# Patient Record
Sex: Male | Born: 1980 | Race: White | Hispanic: No | Marital: Married | Smoking: Never smoker
Health system: Southern US, Community
[De-identification: ages and names within clinical notes are randomized; demographics above are authoritative.]

## PROBLEM LIST (undated history)

## (undated) HISTORY — PX: ORTHOPEDIC SURGERY: SHX850

---
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
## 2003-08-24 ENCOUNTER — Emergency Department (HOSPITAL_COMMUNITY): Admission: EM | Admit: 2003-08-24 | Discharge: 2003-08-25 | Payer: Self-pay

## 2003-10-29 ENCOUNTER — Emergency Department (HOSPITAL_COMMUNITY): Admission: EM | Admit: 2003-10-29 | Discharge: 2003-10-29 | Payer: Self-pay | Admitting: Emergency Medicine

## 2008-12-09 ENCOUNTER — Emergency Department (HOSPITAL_BASED_OUTPATIENT_CLINIC_OR_DEPARTMENT_OTHER): Admission: EM | Admit: 2008-12-09 | Discharge: 2008-12-09 | Payer: Self-pay | Admitting: Emergency Medicine

## 2012-02-28 ENCOUNTER — Emergency Department (HOSPITAL_BASED_OUTPATIENT_CLINIC_OR_DEPARTMENT_OTHER)
Admission: EM | Admit: 2012-02-28 | Discharge: 2012-02-28 | Disposition: A | Discharge status: Home / Self Care | Payer: BC Managed Care – PPO | Attending: Emergency Medicine | Admitting: Emergency Medicine

## 2012-02-28 ENCOUNTER — Emergency Department (HOSPITAL_BASED_OUTPATIENT_CLINIC_OR_DEPARTMENT_OTHER): Payer: BC Managed Care – PPO

## 2012-02-28 ENCOUNTER — Encounter (HOSPITAL_BASED_OUTPATIENT_CLINIC_OR_DEPARTMENT_OTHER): Payer: Self-pay | Admitting: Emergency Medicine

## 2012-02-28 DIAGNOSIS — R42 Dizziness and giddiness: Secondary | ICD-10-CM | POA: Insufficient documentation

## 2012-02-28 DIAGNOSIS — E860 Dehydration: Secondary | ICD-10-CM | POA: Insufficient documentation

## 2012-02-28 DIAGNOSIS — B349 Viral infection, unspecified: Secondary | ICD-10-CM

## 2012-02-28 DIAGNOSIS — R5381 Other malaise: Secondary | ICD-10-CM | POA: Insufficient documentation

## 2012-02-28 DIAGNOSIS — R509 Fever, unspecified: Secondary | ICD-10-CM | POA: Insufficient documentation

## 2012-02-28 DIAGNOSIS — M542 Cervicalgia: Secondary | ICD-10-CM | POA: Insufficient documentation

## 2012-02-28 DIAGNOSIS — G43909 Migraine, unspecified, not intractable, without status migrainosus: Secondary | ICD-10-CM | POA: Insufficient documentation

## 2012-02-28 DIAGNOSIS — B9789 Other viral agents as the cause of diseases classified elsewhere: Secondary | ICD-10-CM | POA: Insufficient documentation

## 2012-02-28 DIAGNOSIS — R110 Nausea: Secondary | ICD-10-CM | POA: Insufficient documentation

## 2012-02-28 DIAGNOSIS — Z79899 Other long term (current) drug therapy: Secondary | ICD-10-CM | POA: Insufficient documentation

## 2012-02-28 LAB — CBC WITH DIFFERENTIAL/PLATELET
Basophils Absolute: 0 10*3/uL (ref 0.0–0.1)
Basophils Relative: 0 % (ref 0–1)
Eosinophils Absolute: 0 10*3/uL (ref 0.0–0.7)
HCT: 41.8 % (ref 39.0–52.0)
Hemoglobin: 14.6 g/dL (ref 13.0–17.0)
Lymphs Abs: 0.7 10*3/uL (ref 0.7–4.0)
MCH: 30.5 pg (ref 26.0–34.0)
MCHC: 34.9 g/dL (ref 30.0–36.0)
MCV: 87.3 fL (ref 78.0–100.0)
Monocytes Relative: 10 % (ref 3–12)
Neutrophils Relative %: 84 % — ABNORMAL HIGH (ref 43–77)
RDW: 11.9 % (ref 11.5–15.5)

## 2012-02-28 LAB — COMPREHENSIVE METABOLIC PANEL
Albumin: 4.2 g/dL (ref 3.5–5.2)
Alkaline Phosphatase: 67 U/L (ref 39–117)
CO2: 25 mEq/L (ref 19–32)
GFR calc non Af Amer: >90 mL/min (ref >90)
Sodium: 138 mEq/L (ref 135–145)
Total Bilirubin: 1.3 mg/dL — ABNORMAL HIGH (ref 0.3–1.2)

## 2012-02-28 LAB — URINALYSIS, ROUTINE W REFLEX MICROSCOPIC
Glucose, UA: NEGATIVE mg/dL
Hgb urine dipstick: NEGATIVE
Nitrite: NEGATIVE
Urobilinogen, UA: 1 mg/dL (ref 0.0–1.0)

## 2012-02-28 LAB — LACTIC ACID, PLASMA: Lactic Acid, Venous: 0.9 mmol/L (ref 0.5–2.2)

## 2012-02-28 LAB — LIPASE, BLOOD: Lipase: 49 U/L (ref 11–59)

## 2012-02-28 MED ORDER — DIPHENHYDRAMINE HCL 50 MG/ML IJ SOLN
12.5000 mg | Freq: Once | INTRAMUSCULAR | Status: AC
  Administered 2012-02-28: 12.5 mg via INTRAVENOUS
  Filled 2012-02-28: qty 1

## 2012-02-28 MED ORDER — KETOROLAC TROMETHAMINE 30 MG/ML IJ SOLN
30.0000 mg | Freq: Once | INTRAMUSCULAR | Status: AC
  Administered 2012-02-28: 30 mg via INTRAVENOUS

## 2012-02-28 MED ORDER — SODIUM CHLORIDE 0.9 % IV BOLUS (SEPSIS)
1000.0000 mL | Freq: Once | INTRAVENOUS | Status: AC
  Administered 2012-02-28: 1000 mL via INTRAVENOUS

## 2012-02-28 MED ORDER — KETOROLAC TROMETHAMINE 30 MG/ML IJ SOLN
INTRAMUSCULAR | Status: AC
  Filled 2012-02-28: qty 1

## 2012-02-28 MED ORDER — METOCLOPRAMIDE HCL 5 MG/ML IJ SOLN
10.0000 mg | Freq: Once | INTRAMUSCULAR | Status: AC
  Administered 2012-02-28: 10 mg via INTRAVENOUS
  Filled 2012-02-28: qty 2

## 2012-02-28 NOTE — ED Notes (Signed)
MD at bedside. 

## 2012-02-28 NOTE — ED Notes (Signed)
Onset 11am  Chills fever no appetite, headache,body aches fatigue

## 2012-02-28 NOTE — ED Provider Notes (Signed)
History     CSN: [id number]  Arrival date & time 02/28/12  1631   First MD Initiated Contact with Patient 02/28/12 1720      Chief Complaint  Patient presents with  . Fever    (Consider location/radiation/quality/duration/timing/severity/associated sxs/prior treatment) HPI Patient is a 31 year old male who presents today complaining of having onset today at 11 AM chills and fever. He reports having no headache and also complaining of diffuse severe body aches and chills. Patient was at work when this began. He came home and awoke with a fever of 103 taken orally. He is afebrile on presentation here. He is mild tachycardia with a heart rate of 101.vital signs are otherwise within normal limits. He denies cough, urinary symptoms, and vomiting. He denies constipation diarrhea. Patient does endorse a headache which she reports as an 8/10. This is similar to prior migraines. He also complains of some neck discomfort but has full range of motion and no rashes. He has no reported tick exposures. Patient does have a daughter at home who is sick with a "summer cold" last week. Patient has not taken anything for his symptoms. Patient has no history of medical problems. Patient reports that his headache is bilateral and frontal and described as an ache. There's no radiation. There are no other associated or modifying factors. History reviewed. No pertinent past medical history.  Past Surgical History  Procedure Date  . Orthopedic surgery     History reviewed. No pertinent family history.  History  Substance Use Topics  . Smoking status: Never Smoker   . Smokeless tobacco: Not on file  . Alcohol Use: Yes     occ      Review of Systems  Constitutional: Positive for fever, chills and fatigue.  HENT: Positive for neck pain.   Eyes: Negative.   Respiratory: Negative.   Cardiovascular: Negative.   Gastrointestinal: Positive for nausea.  Genitourinary: Negative.   Skin: Negative.  Negative  for rash.  Neurological: Positive for light-headedness and headaches.  Hematological: Negative.   Psychiatric/Behavioral: Negative.   All other systems reviewed and are negative.    Allergies  Review of patient's allergies indicates no known allergies.  Home Medications   Current Outpatient Rx  Name Route Sig Dispense Refill  . CLOMIPHENE CITRATE 50 MG PO TABS Oral Take 50 mg by mouth daily.    Marland Kitchen GLUCOSAMINE HCL PO Oral Take 1 tablet by mouth daily.    Marland Kitchen NAPROXEN SODIUM 220 MG PO TABS Oral Take 220 mg by mouth daily. For back pain    . FISH OIL PO Oral Take 1 capsule by mouth daily.      BP 112/74  Pulse 101  Temp 98.5 F (36.9 C) (Oral)  Resp 20  Ht 6' (1.829 m)  Wt 210 lb (95.255 kg)  BMI 28.48 kg/m2  SpO2 98%  Physical Exam  Nursing note and vitals reviewed. GEN: Well-developed, well-nourished male in no distress HEENT: Atraumatic, normocephalic. Oropharynx clear without erythema EYES: PERRLA BL, no scleral icterus. NECK: Trachea midline, no meningismus, pain-free full range of motion. Some tenderness to palpation over the paraspinal muscles  CV: regular rate and rhythm. No murmurs, rubs, or gallops PULM: No respiratory distress.  No crackles, wheezes, or rales. GI: soft, non-tender. No guarding, rebound, or tenderness. + bowel sounds  GU: deferred Neuro: cranial nerves grossly 2-12 intact, no abnormalities of strength or sensation, A and O x 3 MSK: Patient moves all 4 extremities symmetrically, no deformity, edema, or injury  noted Skin: No rashes petechiae, purpura, or jaundice Psych: no abnormality of mood   ED Course  Procedures (including critical care time)  Labs Reviewed  CBC WITH DIFFERENTIAL - Abnormal; Notable for the following:    Neutrophils Relative 84 (*)     Neutro Abs 8.7 (*)     Lymphocytes Relative 7 (*)     All other components within normal limits  COMPREHENSIVE METABOLIC PANEL - Abnormal; Notable for the following:    Glucose, Bld 116  (*)     Total Bilirubin 1.3 (*)     All other components within normal limits  LIPASE, BLOOD  URINALYSIS, ROUTINE W REFLEX MICROSCOPIC  LACTIC ACID, PLASMA   Dg Chest 2 View  02/28/2012  *RADIOLOGY REPORT*  Clinical Data: Fatigue, dizziness.  Lightheadedness.  CHEST - 2 VIEW  Comparison: None.  Findings: Cardiomediastinal silhouette is within normal limits. The lungs are free of focal consolidations and pleural effusions. No pulmonary edema. Visualized osseous structures have a normal appearance.  IMPRESSION: Negative exam.   Original Report Authenticated By: Patterson Hammersmith, M.D.      1. Viral syndrome   2. Dehydration   3. Migraine       MDM   Patient was evaluated by myself. He reported fever to 103 at home. The patient felt warm repeat temperatures are within normal limits. Vital signs are not abnormal here. Patient was treated with IV fluids. Patient had headache similar to prior migraines. He was treated with Reglan and Benadryl for this. He at no rash nicely no meningismus. Laboratory workup showed no leukocytosis, no signs of pneumonia, no urinary tract infection, no abnormality of liver function or electrolytes. Patient did have elevated BUN and creatinine ratio. I suspect he had viral symptoms similar to those of his daughter and had with resultant dehydration likely development of one of his typical migraines as a result. Patient was feeling much better and also received a dose of Toradol 30 mg IV. He was instructed to return if he had return of his fever that had not resolved with ibuprofen or Tylenol. Patient is told to drink plenty of fluids. Work note was provided. He was discharged in good condition.        Cyndra Numbers, MD 02/28/12 2002

## 2013-03-26 IMAGING — CR DG CHEST 2V
2 series · 2 of 2 positions shown · non-contrast
Comparison: None.

CLINICAL DATA: Fatigue, dizziness.  Lightheadedness.

CHEST - 2 VIEW

[w chest pa]
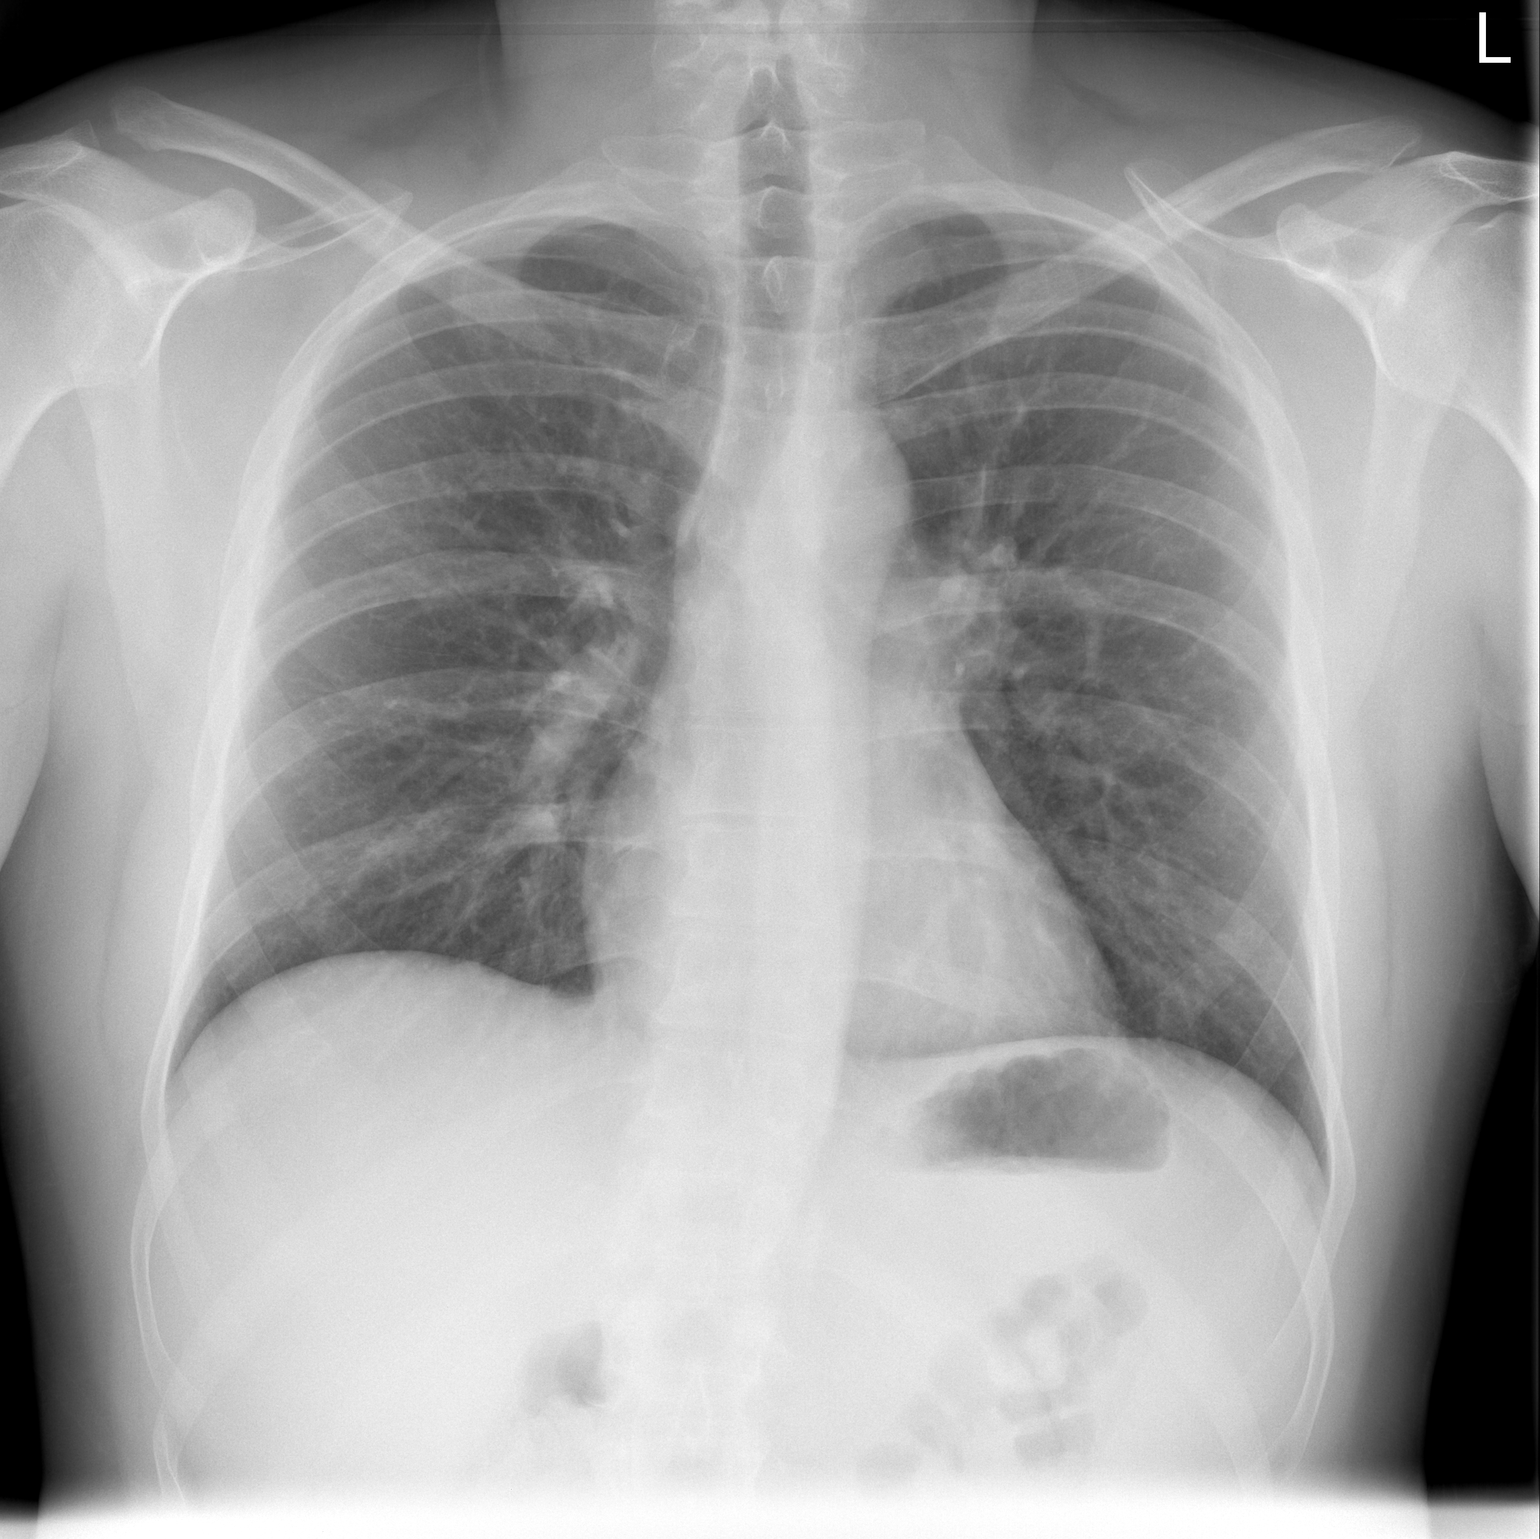

[w chest lat]
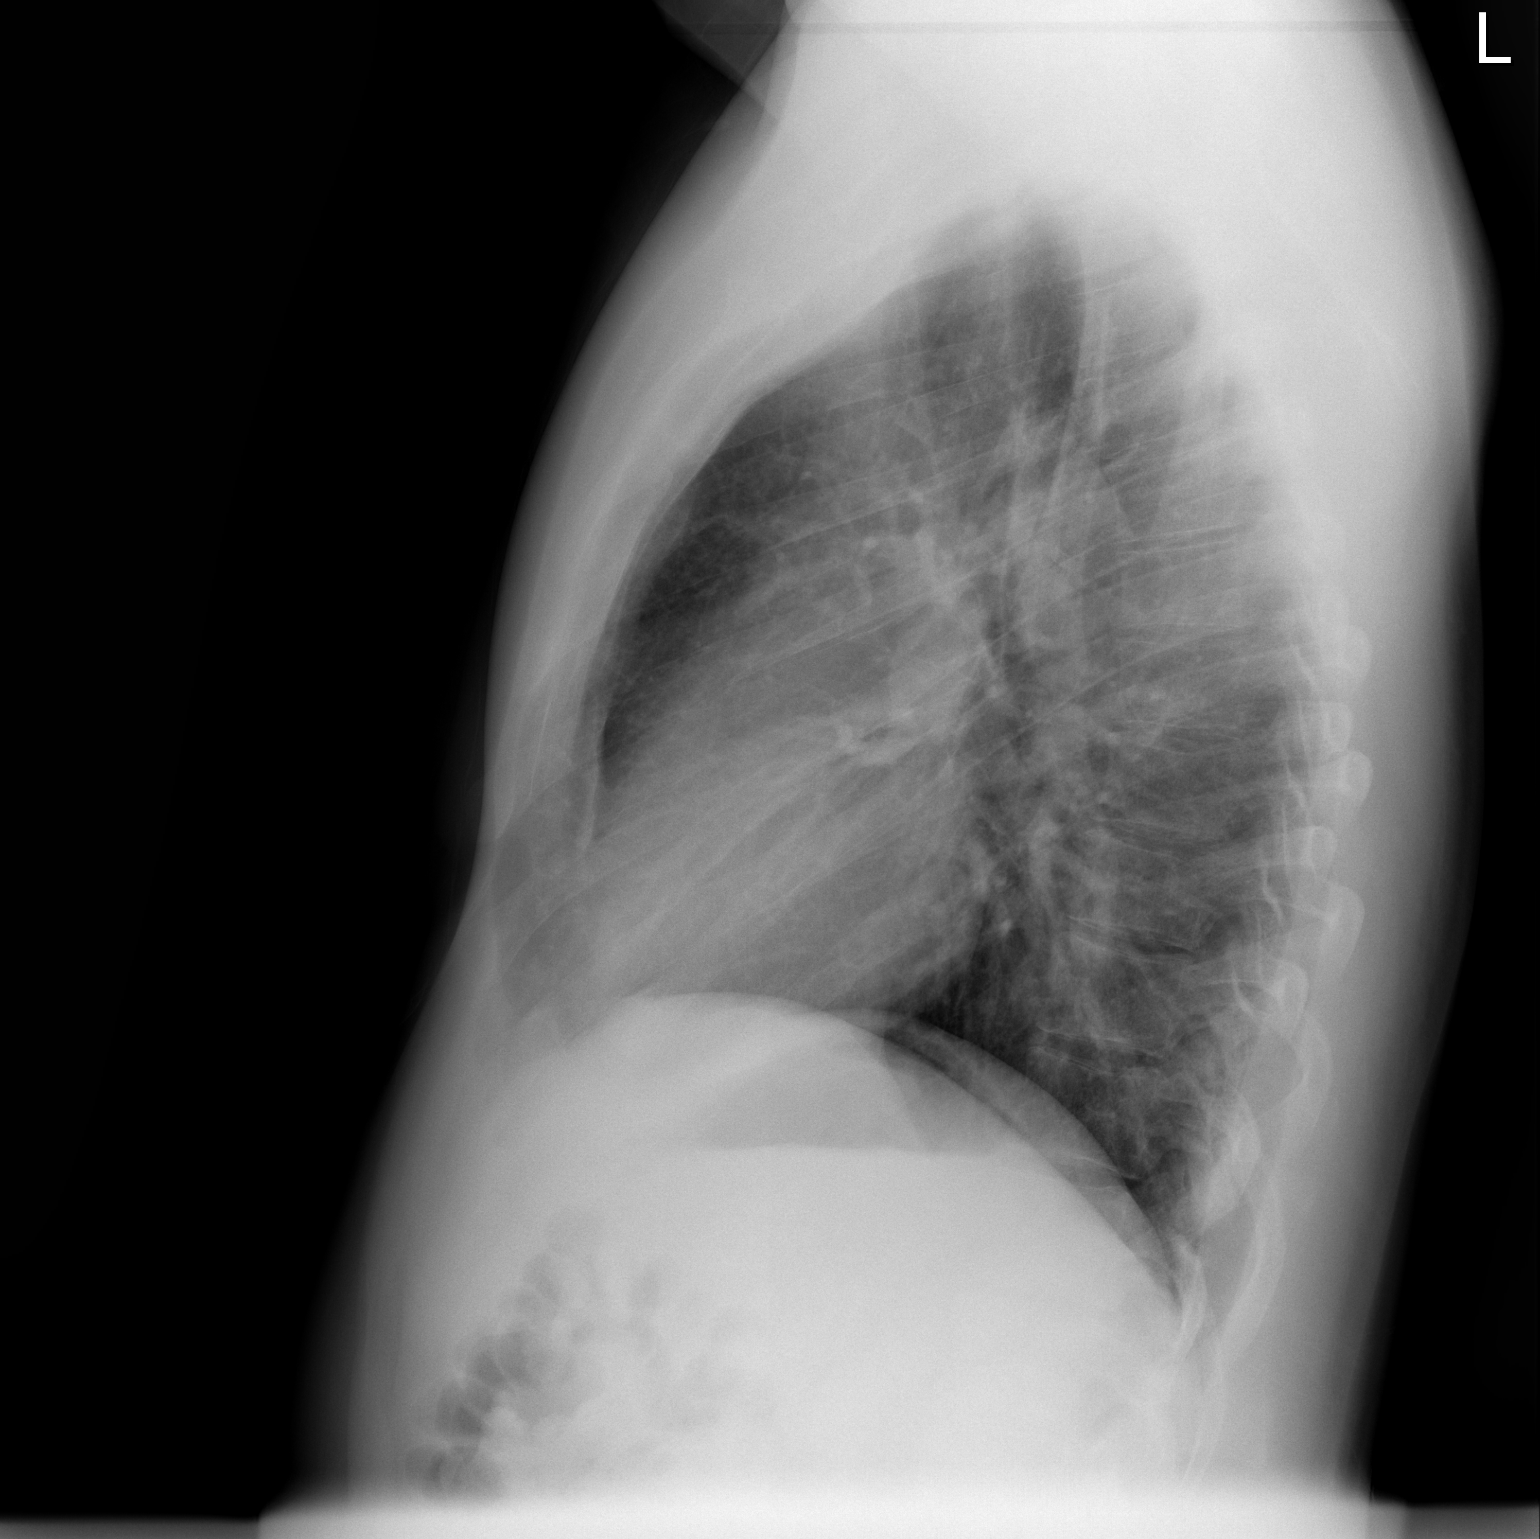

[2 of 2 positions shown; findings below may reference images not displayed]

FINDINGS: Cardiomediastinal silhouette is within normal limits.
The lungs are free of focal consolidations and pleural effusions.
No pulmonary edema. Visualized osseous structures have a normal
appearance.
IMPRESSION: Negative exam.

## 2018-04-22 ENCOUNTER — Ambulatory Visit (HOSPITAL_COMMUNITY)
Admission: RE | Admit: 2018-04-22 | Discharge: 2018-04-22 | Disposition: A | Discharge status: Home / Self Care | Payer: 59 | Attending: Psychiatry | Admitting: Psychiatry

## 2018-04-22 DIAGNOSIS — F3113 Bipolar disorder, current episode manic without psychotic features, severe: Secondary | ICD-10-CM | POA: Insufficient documentation

## 2018-04-23 ENCOUNTER — Encounter (HOSPITAL_COMMUNITY): Payer: Self-pay

## 2018-04-23 ENCOUNTER — Inpatient Hospital Stay (HOSPITAL_COMMUNITY)
Admission: AD | Admit: 2018-04-23 | Discharge: 2018-04-28 | DRG: 885 | Disposition: A | Discharge status: Home / Self Care | Payer: 59 | Attending: Psychiatry | Admitting: Psychiatry

## 2018-04-23 ENCOUNTER — Other Ambulatory Visit: Payer: Self-pay

## 2018-04-23 ENCOUNTER — Emergency Department (HOSPITAL_COMMUNITY)
Admission: EM | Admit: 2018-04-23 | Discharge: 2018-04-23 | Disposition: A | Discharge status: Other Acute Inpatient Hospital | Payer: 59 | Attending: Emergency Medicine | Admitting: Emergency Medicine

## 2018-04-23 DIAGNOSIS — F909 Attention-deficit hyperactivity disorder, unspecified type: Secondary | ICD-10-CM | POA: Diagnosis present

## 2018-04-23 DIAGNOSIS — G4700 Insomnia, unspecified: Secondary | ICD-10-CM | POA: Diagnosis present

## 2018-04-23 DIAGNOSIS — F302 Manic episode, severe with psychotic symptoms: Principal | ICD-10-CM | POA: Insufficient documentation

## 2018-04-23 DIAGNOSIS — Z79899 Other long term (current) drug therapy: Secondary | ICD-10-CM | POA: Insufficient documentation

## 2018-04-23 DIAGNOSIS — F312 Bipolar disorder, current episode manic severe with psychotic features: Secondary | ICD-10-CM

## 2018-04-23 DIAGNOSIS — F419 Anxiety disorder, unspecified: Secondary | ICD-10-CM | POA: Diagnosis present

## 2018-04-23 DIAGNOSIS — F319 Bipolar disorder, unspecified: Secondary | ICD-10-CM | POA: Diagnosis present

## 2018-04-23 LAB — COMPREHENSIVE METABOLIC PANEL
ALBUMIN: 4.4 g/dL (ref 3.5–5.0)
ALK PHOS: 72 U/L (ref 38–126)
ALT: 31 U/L (ref 0–44)
AST: 31 U/L (ref 15–41)
Anion gap: 7 (ref 5–15)
BUN: 17 mg/dL (ref 6–20)
CALCIUM: 9 mg/dL (ref 8.9–10.3)
CO2: 27 mmol/L (ref 22–32)
CREATININE: 0.81 mg/dL (ref 0.61–1.24)
Chloride: 106 mmol/L (ref 98–111)
GFR calc Af Amer: >60 mL/min (ref >60)
GFR calc non Af Amer: >60 mL/min (ref >60)
GLUCOSE: 113 mg/dL — AB (ref 70–99)
Potassium: 3.7 mmol/L (ref 3.5–5.1)
SODIUM: 140 mmol/L (ref 135–145)
Total Bilirubin: 1 mg/dL (ref 0.3–1.2)
Total Protein: 7.2 g/dL (ref 6.5–8.1)

## 2018-04-23 LAB — RAPID URINE DRUG SCREEN, HOSP PERFORMED
AMPHETAMINES: NOT DETECTED
Barbiturates: NOT DETECTED
Benzodiazepines: NOT DETECTED
COCAINE: NOT DETECTED
OPIATES: NOT DETECTED
TETRAHYDROCANNABINOL: POSITIVE — AB

## 2018-04-23 LAB — CBC
HEMATOCRIT: 45.7 % (ref 39.0–52.0)
Hemoglobin: 14.7 g/dL (ref 13.0–17.0)
MCH: 29.4 pg (ref 26.0–34.0)
MCHC: 32.2 g/dL (ref 30.0–36.0)
MCV: 91.4 fL (ref 80.0–100.0)
NRBC: 0 % (ref 0.0–0.2)
PLATELETS: 199 10*3/uL (ref 150–400)
RBC: 5 MIL/uL (ref 4.22–5.81)
RDW: 11.9 % (ref 11.5–15.5)
WBC: 8.9 10*3/uL (ref 4.0–10.5)

## 2018-04-23 LAB — ETHANOL: Alcohol, Ethyl (B): <10 mg/dL (ref <10)

## 2018-04-23 MED ORDER — OLANZAPINE 5 MG PO TBDP
5.0000 mg | ORAL_TABLET | Freq: Two times a day (BID) | ORAL | Status: DC
  Filled 2018-04-23: qty 1

## 2018-04-23 MED ORDER — LORAZEPAM 1 MG PO TABS: 1.0000 mg | ORAL_TABLET | Freq: Once | ORAL | Status: DC

## 2018-04-23 MED ORDER — ALUM & MAG HYDROXIDE-SIMETH 200-200-20 MG/5ML PO SUSP: 30.0000 mL | ORAL | Status: DC | PRN

## 2018-04-23 MED ORDER — MAGNESIUM HYDROXIDE 400 MG/5ML PO SUSP: 30.0000 mL | Freq: Every day | ORAL | Status: DC | PRN

## 2018-04-23 MED ORDER — TRAZODONE HCL 50 MG PO TABS
50.0000 mg | ORAL_TABLET | Freq: Every evening | ORAL | Status: DC | PRN
  Filled 2018-04-23: qty 1

## 2018-04-23 MED ORDER — OLANZAPINE 5 MG PO TBDP
5.0000 mg | ORAL_TABLET | Freq: Two times a day (BID) | ORAL | Status: DC
  Filled 2018-04-23 (×6): qty 1

## 2018-04-23 MED ORDER — ACETAMINOPHEN 325 MG PO TABS: 650.0000 mg | ORAL_TABLET | Freq: Four times a day (QID) | ORAL | Status: DC | PRN

## 2018-04-23 NOTE — BH Assessment (Signed)
[facility] Assessment Progress Note  Per Juanetta Beets, DO, this pt requires psychiatric hospitalization.  Malva Limes, RN, [facility] has assigned pt to [facility] Rm [id number]; [facility] will be ready to receive pt at 13:00.  Pt presents under IVC initiated by EDP Cathren Laine, MD, and IVC documents have been faxed to [facility]  Pt's nurse, Diane, has been notified, and agrees to call report to [phone].  Pt is to be transported via [employer].   Doylene Canning, [place] [facility] Coordinator [phone]

## 2018-04-23 NOTE — ED Notes (Signed)
Pt states that he didn't want his ativan at this time

## 2018-04-23 NOTE — ED Notes (Signed)
Pt is not wanting to stay and doesn't understand how this happened, he states that he only agreed to go to [facility] is to prove to his dad and brother that nothing was wrong with him, he says now he's tired of playing the game and he's ready to go

## 2018-04-23 NOTE — Progress Notes (Signed)
Adult Psychoeducational Group Note  Date:  04/23/2018 Time:  8:41 PM  Group Topic/Focus:  Wrap-Up Group:   The focus of this group is to help patients review their daily goal of treatment and discuss progress on daily workbooks.  Participation Level:  Active  Participation Quality:  Appropriate  Affect:  Appropriate  Cognitive:  Appropriate  Insight: Appropriate  Engagement in Group:  Engaged  Modes of Intervention:  Discussion  Additional Comments: The patient expressed that he rates today a 10.The patient also said that he attended groups.  Octavio Manns 04/23/2018, 8:41 PM

## 2018-04-23 NOTE — Progress Notes (Signed)
Nursing Progress Note: 7p-7a D: Pt currently presents with a anxious/manic affect and behavior. Pt states "I never take meds unless it's advil. I don't need it and/or want pills." Interacting appropriately with the milieu. Pt reports good sleep during the previous night with current medication regimen. Pt did attend wrap-up group.  A: Pt refused medications per providers orders. Pt's labs and vitals were monitored throughout the night. Pt supported emotionally and encouraged to express concerns and questions. Pt educated on medications.  R: Pt's safety ensured with 15 minute and environmental checks. Pt currently denies SI, HI, and AVH. Pt verbally contracts to seek staff if SI,HI, or AVH occurs and to consult with staff before acting on any harmful thoughts. Will continue to monitor.

## 2018-04-23 NOTE — Progress Notes (Signed)
Patient ID: Arthur Mitchell, male   DOB: 1981/01/16, 37 y.o.   MRN: [id number] Patient admitted to the unit due to increase bizarre behaviors, delusions of grandeur believing that he could make his family millions by starting a new cooperation and having them invest 75.00 a month.  Patient is hyperverbal, euphoric, and tangential.  Skin assessment complete patient found to be free of all injury. Patient was also found to be free of all contraband.   Patient cooperative during admission process and admitted and oriented to the unit without incident.

## 2018-04-23 NOTE — ED Notes (Signed)
Report called Engineer, site. Bed is not ready as another pt occupies it. Nurse Christen Bame will call when the bed is ready.

## 2018-04-23 NOTE — ED Notes (Signed)
Refused medications and said, "I do not take any medications, except Adderal. I smoke marijuana. I have pain from past broken femur and do not take pain medication for that." Speech is pressured with tangential thoughts. Pt is cooperative and not unpleasant.

## 2018-04-23 NOTE — ED Notes (Signed)
Per [facility], patient is completely manic and is having delusional ideas and thoughts Pt qualifies for inpatient but no rooms available

## 2018-04-23 NOTE — ED Triage Notes (Signed)
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

## 2018-04-23 NOTE — ED Notes (Signed)
Patient is calm and cooperative at this time. Patient states he is here under a misunderstanding. Patient denies SI/HI/AVH. Plan of care discussed. Encouragement and support provided and safety maintain. Q 15 min safety checks in place and video monitoring.

## 2018-04-23 NOTE — ED Notes (Signed)
Called [facility] for transport to [facility] 

## 2018-04-23 NOTE — ED Provider Notes (Signed)
[facility]-EMERGENCY DEPT Provider Note   CSN: [id number] Arrival date & time: 04/23/18  0045     History   Chief Complaint Chief Complaint  Patient presents with  . Medical Clearance    HPI Arthur Mitchell is a 37 y.o. male.  Patient c/o hx adhd presents from behavioral health - they feel needs inpatient tx for acute manic/bipolar episode, but currently no inpatient beds there. Pt has little insight into current symptoms - level 5 caveat. 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 Pt very rapidly moves from one unrelated thought to next, flight of ideas. Pt states he has not taken his meds in 1 week and has never felt better. State he is full of energy, and only needs 3 hours of sleep per night, 'never better'.   The history is provided by the patient. The history is limited by the condition of the patient.    History reviewed. No pertinent past medical history.  There are no active problems to display for this patient.   Past Surgical History:  Procedure Laterality Date  . ORTHOPEDIC SURGERY          Home Medications    Prior to Admission medications   Medication Sig Start Date End Date Taking? Authorizing Provider  clomiPHENE (CLOMID) 50 MG tablet Take 50 mg by mouth daily.    [provider]  GLUCOSAMINE HCL PO Take 1 tablet by mouth daily.    [provider]  naproxen sodium (ALEVE) 220 MG tablet Take 220 mg by mouth daily. For back pain    [provider]  Omega-3 Fatty Acids (FISH OIL PO) Take 1 capsule by mouth daily.    [provider]    Family History History reviewed. No pertinent family history.  Social History Social History   Tobacco Use  . Smoking status: Never Smoker  . Smokeless tobacco: Never  Used  Substance Use Topics  . Alcohol use: Yes    Comment: occ  . Drug use: No     Allergies   Patient has no known allergies.   Review of Systems Review of Systems  Constitutional: Negative for fever.  HENT: Negative for sore throat.   Eyes: Negative for redness.  Respiratory: Negative for shortness of breath.   Cardiovascular: Negative for chest pain.  Gastrointestinal: Negative for abdominal pain.  Genitourinary: Negative for flank pain.  Musculoskeletal: Negative for back pain and neck pain.  Skin: Negative for rash.  Neurological: Negative for headaches.  Hematological: Does not bruise/bleed easily.  Psychiatric/Behavioral: Negative for suicidal ideas.     Physical Exam Updated Vital Signs BP (!) 141/96 (BP Location: Left Arm)   Pulse 60   Temp 98.3 F (36.8 C) (Oral)   Resp 18   Ht 1.854 m (6\' 1" )   Wt 83.9 kg   SpO2 100%   BMI 24.41 kg/m   Physical Exam  Constitutional: He appears well-developed and well-nourished.  HENT:  Mouth/Throat: Oropharynx is clear and moist.  Eyes: Pupils are equal, round, and reactive to light. Conjunctivae are normal.  Neck: Neck supple. No tracheal deviation present. No thyromegaly present.  Cardiovascular: Normal rate, regular rhythm, normal heart sounds and intact distal pulses.  Pulmonary/Chest: Effort normal and breath sounds normal. No accessory muscle usage. No  respiratory distress.  Abdominal: Soft. Bowel sounds are normal. He exhibits no distension. There is no tenderness.  Musculoskeletal: He exhibits no edema.  Neurological: He is alert.  Speech clear/fluent. Ambulates w steady gait.   Skin: Skin is warm and dry. No rash noted.  Psychiatric:  Patient appears to be acutely manic, elevated mood, hyperverbal, flight of ideas.   Nursing note and vitals reviewed.    ED Treatments / Results  Labs (all labs ordered are listed, but only abnormal results are displayed) Results for orders placed or performed during  the hospital encounter of 04/23/18  Comprehensive metabolic panel  Result Value Ref Range   Sodium 140 135 - 145 mmol/L   Potassium 3.7 3.5 - 5.1 mmol/L   Chloride 106 98 - 111 mmol/L   CO2 27 22 - 32 mmol/L   Glucose, Bld 113 (H) 70 - 99 mg/dL   BUN 17 6 - 20 mg/dL   Creatinine, Ser 1.61 0.61 - 1.24 mg/dL   Calcium 9.0 8.9 - 09.6 mg/dL   Total Protein 7.2 6.5 - 8.1 g/dL   Albumin 4.4 3.5 - 5.0 g/dL   AST 31 15 - 41 U/L   ALT 31 0 - 44 U/L   Alkaline Phosphatase 72 38 - 126 U/L   Total Bilirubin 1.0 0.3 - 1.2 mg/dL   GFR calc non Af Amer >60 >60 mL/min   GFR calc Af Amer >60 >60 mL/min   Anion gap 7 5 - 15  Ethanol  Result Value Ref Range   Alcohol, Ethyl (B) <10 <10 mg/dL  cbc  Result Value Ref Range   WBC 8.9 4.0 - 10.5 K/uL   RBC 5.00 4.22 - 5.81 MIL/uL   Hemoglobin 14.7 13.0 - 17.0 g/dL   HCT 04.5 40.9 - 81.1 %   MCV 91.4 80.0 - 100.0 fL   MCH 29.4 26.0 - 34.0 pg   MCHC 32.2 30.0 - 36.0 g/dL   RDW 91.4 78.2 - 95.6 %   Platelets 199 150 - 400 K/uL   nRBC 0.0 0.0 - 0.2 %  Rapid urine drug screen (hospital performed)  Result Value Ref Range   Opiates NONE DETECTED NONE DETECTED   Cocaine NONE DETECTED NONE DETECTED   Benzodiazepines NONE DETECTED NONE DETECTED   Amphetamines NONE DETECTED NONE DETECTED   Tetrahydrocannabinol POSITIVE (A) NONE DETECTED   Barbiturates NONE DETECTED NONE DETECTED   EKG None  Radiology No results found.  Procedures Procedures (including critical care time)  Medications Ordered in ED Medications - No data to display   Initial Impression / Assessment and Plan / ED Course  I have reviewed the triage vital signs and the nursing notes.  Pertinent labs & imaging results that were available during my care of the patient were reviewed by me and considered in my medical decision making (see chart for details).  Reviewed nursing notes and prior charts for additional history.   BH team has assessed and feels needs inpatient psych tx.    Labs sent.  Labs reviewed - chem normal.  Await BH eval and placement.   Dispo per [facility] team.   Pt threatening to leave. As patient is acutely psychotic/mentally ill, will do ivc papers.     Final Clinical Impressions(s) / ED Diagnoses   Final diagnoses:  None    ED Discharge Orders    None       Cathren Laine, MD 04/23/18 [phone]

## 2018-04-23 NOTE — Tx Team (Signed)
Initial Treatment Plan 04/23/2018 6:22 PM Arthur Mitchell NFA:[id number]    PATIENT STRESSORS: Medication change or noncompliance   PATIENT STRENGTHS: Supportive family/friends   PATIENT IDENTIFIED PROBLEMS: "I want to work on myself"  "God has be here for a reason"                   DISCHARGE CRITERIA:  Ability to meet basic life and health needs Verbal commitment to aftercare and medication compliance  PRELIMINARY DISCHARGE PLAN: Return to previous living arrangement  PATIENT/FAMILY INVOLVEMENT: This treatment plan has been presented to and reviewed with the patient, Arthur Mitchell, and/or family member  The patient and family have been given the opportunity to ask questions and make suggestions.  Jerrye Bushy, RN 04/23/2018, 6:22 PM

## 2018-04-23 NOTE — ED Notes (Signed)
Bed: [facility]4 Expected date:  Expected time:  Means of arrival:  Comments: 

## 2018-04-23 NOTE — BH Assessment (Signed)
Assessment Note  Arthur Mitchell is a 37 y.o. male who was brought to [facility] by his brother and his father due to to their concerns that he has been acting irrationally lately. With pt's permission, clinician talked to pt's father and brother, and they shared that pt has been acting "out of character" for several weeks, and especially this last week. Pt's father and brother share that even pt's daughters, who are 33 and 56 years old, have expressed concern over their father's strange behaviors. Pt's brother-in-law, his wife's brother, was killed due to drug violence last week, and his father and brother believe pt's symptoms may have gotten worse since then. Pt's brother and father deny pt has even acted in this manner in the past. Pt shares he believes he has had a "drastic change in personality" since his brother-in-law died; he states he was previously always very quiet and reserved but, since then, he has been very talkative and loud. Pt shares he has decided to share his ideas openly and with others instead of keeping them to himself; he states he believes he needs to share love with everyone and reach out to others to assist them in meeting their goals. Pt states he randomly met someone the other day and paid them $500 to assist him in doing the trim work on his house. He states he met another person yesterday and bought them $5,000 in tools. He later stated he took these tools and was able to build a display to show his life and what it has meant and what it portrays and that he can show this display to [place], Randall Hiss, and AOC (Senator Olam Idler [place]ez), among others.  Pt denies SI; he shares he was experiencing SI between the ages of 59 - 29 but states he had no plan and has had no attempts and no hospitalizations. Pt denies any HI and NSSIB. Pt also denies AVH, "though he shares he has experienced "energy from other things since [he] was a little kid" and gave the example of trees and  getting life from them.  Pt shares he first smoked marijuana at age 87; he states he now smokes 1 bowl per day and that he last used today. Pt states he began using marijuana daily in December 2017 after he had shoulder surgery because he believed it was better than using painkillers and the potential for becoming addicted to them.  Pt denies access to weapons and any involvement in the legal system. He denies any SI or MH family history, though he shares that his grandfathers on both sides suffered from alcoholism. Pt's father shares that pt's paternal grandmother had MDD and received ECT as a means of treating it. He also shared that pt's paternal uncle had erratic behaviors which required commitment, though he doesn't know what he was diagnosed with. Pt denies any abused inflicted onto him. He shares his greatest support is his wife.  Pt states he has never had a therapist nor a psychiatrist. He states he has found that he only needs 3 hours of sleep, as that's his sleep cycle, and that he's best getting either 3 or 6 so it fall's at the end of either of his cycles. Pt states he typically gets 3-4 hours of sleep per night and that he has been sleeping this much for 15-18 years. He states his appetite has been "solid" and that he needed to lose 10 lbs recently and that he did by eating healthy, and  went on to discuss his diet. Pt's father and brother shared that pt lost 15 pounds recently, but shared that pt lost it very quickly and that it did not appear that he lost it in a safe manner.   Pt is oriented x4. His recent and remote memory is intact. Pt is overall cooperative throughout the assessment process, though he notes throughout that the assessment that it is unnecessary and that he's only here because his brother and his father are not used to him speaking up. Pt was speaking rapidly throughout the assessment. When he agreed to allow his father and brother to join [place] for the assessment to provide  additional information, pt argued with the information that his brother and his father provided.   Diagnosis: F31.13, Bipolar I disorder, Current or most recent episode manic, Severe   Past Medical History: No past medical history on file.  Past Surgical History:  Procedure Laterality Date  . ORTHOPEDIC SURGERY      Family History: No family history on file.  Social History:  reports that he has never smoked. He has never used smokeless tobacco. He reports that he drinks alcohol. He reports that he does not use drugs.  Additional Social History:  Alcohol / Drug Use Pain Medications: Please see MAR Prescriptions: Please see MAR Over the Counter: Please see MAR History of alcohol / drug use?: Yes Longest period of sobriety (when/how long): Unknown Substance #1 Name of Substance 1: Marijuana 1 - Age of First Use: 12 1 - Amount (size/oz): 1 bowl/day 1 - Frequency: Daily 1 - Duration: Daily since December 2017 1 - Last Use / Amount: Today (04/22/18)  CIWA:   COWS:    Allergies: No Known Allergies  Home Medications:  (Not in a hospital admission)  OB/GYN Status:  No LMP for male patient.  General Assessment Data Location of Assessment: [facility] Assessment Services TTS Assessment: In system Is this a Tele or Face-to-Face Assessment?: Face-to-Face Is this an Initial Assessment or a Re-assessment for this encounter?: Initial Assessment Patient Accompanied by:: Parent, Adult(Pt's father and brother joined the assessment later) Permission Given to speak with another: Yes(Pt gave permission for clinician to talk to brother & dad) Name, Relationship and Phone Number: Jonty Morrical and Robin Searing Language Other than English: No Living Arrangements: Other (Comment)(Pt lives with his wife and his two daughters) What gender do you identify as?: Male Marital status: Married Pharmacist, community name: [place] Pregnancy Status: No Living Arrangements: Spouse/significant other, Children Can pt  return to current living arrangement?: Yes Admission Status: Voluntary Is patient capable of signing voluntary admission?: Yes Referral Source: Self/Family/Friend Insurance type: [place] Screening Exam ([place]) Medical Exam completed: Yes  Crisis Care Plan Living Arrangements: Spouse/significant other, Children Legal Guardian: (N/A) Name of Psychiatrist: N/A Name of Therapist: N/A  Education Status Is patient currently in school?: No Is the patient employed, unemployed or receiving disability?: Employed  Risk to self with the past 6 months Suicidal Ideation: No Has patient been a risk to self within the past 6 months prior to admission? : No Suicidal Intent: No Has patient had any suicidal intent within the past 6 months prior to admission? : No Is patient at risk for suicide?: No Suicidal Plan?: No Has patient had any suicidal plan within the past 6 months prior to admission? : No Access to Means: No What has been your use of drugs/alcohol within the last 12 months?: Pt acknowledges daily marijuana use Previous Attempts/Gestures: No How  many times?: 0 Other Self Harm Risks: None noted Triggers for Past Attempts: None known Intentional Self Injurious Behavior: None Family Suicide History: No Recent stressful life event(s): Conflict (Comment)(Upset with his brother & dad re: his future plans) Persecutory voices/beliefs?: No Depression: No Substance abuse history and/or treatment for substance abuse?: No Suicide prevention information given to non-admitted patients: Not applicable  Risk to Others within the past 6 months Homicidal Ideation: No Does patient have any lifetime risk of violence toward others beyond the six months prior to admission? : No Thoughts of Harm to Others: No Current Homicidal Intent: No Current Homicidal Plan: No Access to Homicidal Means: No Identified Victim: None noted History of harm to others?: No Assessment of Violence: On  admission Violent Behavior Description: None noted Does patient have access to weapons?: No(Pt denied) Criminal Charges Pending?: No Does patient have a court date: No Is patient on probation?: No  Psychosis Hallucinations: None noted Delusions: Grandiose  Mental Status Report Appearance/Hygiene: Unremarkable Eye Contact: Good Motor Activity: Gestures, Restlessness Speech: Rapid, Other (Comment)(Argumentative when dad & brother share concerning behaviors) Level of Consciousness: Alert Mood: Anxious Affect: Anxious, Other (Comment)(Manic) Anxiety Level: Moderate Thought Processes: Flight of Ideas Judgement: Impaired Orientation: Person, Place, Time, Situation Obsessive Compulsive Thoughts/Behaviors: Severe  Cognitive Functioning Concentration: Fair Memory: Recent Intact, Remote Intact Is patient IDD: No Insight: Poor Impulse Control: Poor Appetite: Good Have you had any weight changes? : Loss Amount of the weight change? (lbs): 15 lbs(Pt's brother & dad report this was in a short amount of time) Sleep: No Change Total Hours of Sleep: 4(3-4 hours of sleep over the last 18 years) Vegetative Symptoms: None  ADLScreening [facility] Assessment Services) Patient's cognitive ability adequate to safely complete daily activities?: Yes Patient able to express need for assistance with ADLs?: Yes Independently performs ADLs?: Yes (appropriate for developmental age)  Prior Inpatient Therapy Prior Inpatient Therapy: No  Prior Outpatient Therapy Prior Outpatient Therapy: No Does patient have an ACCT team?: No Does patient have Intensive In-House Services?  : No Does patient have [facility] services? : No Does patient have P4CC services?: No  ADL Screening (condition at time of admission) Patient's cognitive ability adequate to safely complete daily activities?: Yes Is the patient deaf or have difficulty hearing?: No Does the patient have difficulty seeing, even when wearing  glasses/contacts?: No Does the patient have difficulty concentrating, remembering, or making decisions?: Yes Patient able to express need for assistance with ADLs?: Yes Does the patient have difficulty dressing or bathing?: No Independently performs ADLs?: Yes (appropriate for developmental age) Does the patient have difficulty walking or climbing stairs?: No Weakness of Legs: None Weakness of Arms/Hands: None     Therapy Consults (therapy consults require a physician order) PT Evaluation Needed: No OT Evalulation Needed: No SLP Evaluation Needed: No Abuse/Neglect Assessment (Assessment to be complete while patient is alone) Abuse/Neglect Assessment Can Be Completed: Yes Physical Abuse: Denies Verbal Abuse: Denies Sexual Abuse: Denies Exploitation of patient/patient's resources: Denies Self-Neglect: Denies Values / Beliefs Cultural Requests During Hospitalization: None Spiritual Requests During Hospitalization: None Consults Spiritual Care Consult Needed: No Social Work Consult Needed: No Regulatory affairs officer (For Healthcare) Does Patient Have a Medical Advance Directive?: No Would patient like information on creating a medical advance directive?: No - Patient declined       Disposition: Darlyne Russian PA reviewed pt's chart and information and determined that pt meets criteria for inpatient services due to his mania. Pt will be sent to [facility]  for medical clearance. Pt's referral information will be provided to [facility] and his is currently pending.  Disposition Initial Assessment Completed for this Encounter: Yes Disposition of Patient: Admit(Charles Harold Hedge PA determined pt meets inpt hosp criteria) Type of inpatient treatment program: Adult Patient refused recommended treatment: No Mode of transportation if patient is discharged?: N/A Patient referred to: Other (Comment)(Pt was sent to [facility] for medical clearance)  On Site Evaluation by:   Reviewed with  Physician:    Dannielle Burn 04/23/2018 1:17 AM

## 2018-04-24 DIAGNOSIS — F419 Anxiety disorder, unspecified: Secondary | ICD-10-CM

## 2018-04-24 DIAGNOSIS — F302 Manic episode, severe with psychotic symptoms: Principal | ICD-10-CM

## 2018-04-24 DIAGNOSIS — G4700 Insomnia, unspecified: Secondary | ICD-10-CM

## 2018-04-24 LAB — HEPATIC FUNCTION PANEL
ALT: 29 U/L (ref 0–44)
AST: 26 U/L (ref 15–41)
Albumin: 4.5 g/dL (ref 3.5–5.0)
Alkaline Phosphatase: 76 U/L (ref 38–126)
BILIRUBIN DIRECT: 0.3 mg/dL — AB (ref 0.0–0.2)
BILIRUBIN INDIRECT: 1.9 mg/dL — AB (ref 0.3–0.9)
Total Bilirubin: 2.2 mg/dL — ABNORMAL HIGH (ref 0.3–1.2)
Total Protein: 7.5 g/dL (ref 6.5–8.1)

## 2018-04-24 LAB — TSH: TSH: 2.19 u[IU]/mL (ref 0.350–4.500)

## 2018-04-24 LAB — LIPID PANEL
Cholesterol: 145 mg/dL (ref 0–200)
HDL: 29 mg/dL — AB (ref >40)
LDL CALC: 99 mg/dL (ref 0–99)
TRIGLYCERIDES: 85 mg/dL (ref <150)
Total CHOL/HDL Ratio: 5 RATIO
VLDL: 17 mg/dL (ref 0–40)

## 2018-04-24 LAB — HEMOGLOBIN A1C
HEMOGLOBIN A1C: 4.5 % — AB (ref 4.8–5.6)
MEAN PLASMA GLUCOSE: 82.45 mg/dL

## 2018-04-24 MED ORDER — OLANZAPINE 5 MG PO TBDP: 5.0000 mg | ORAL_TABLET | Freq: Three times a day (TID) | ORAL | Status: DC | PRN

## 2018-04-24 MED ORDER — LITHIUM CARBONATE ER 300 MG PO TBCR
300.0000 mg | EXTENDED_RELEASE_TABLET | Freq: Two times a day (BID) | ORAL | Status: DC
  Administered 2018-04-24 – 2018-04-28 (×9): 300 mg via ORAL
  Filled 2018-04-24 (×11): qty 1

## 2018-04-24 MED ORDER — LORAZEPAM 1 MG PO TABS: 1.0000 mg | ORAL_TABLET | Freq: Four times a day (QID) | ORAL | Status: DC | PRN

## 2018-04-24 MED ORDER — LORAZEPAM 2 MG/ML IJ SOLN: 1.0000 mg | Freq: Four times a day (QID) | INTRAMUSCULAR | Status: DC | PRN

## 2018-04-24 MED ORDER — HYDROXYZINE HCL 50 MG PO TABS: 50.0000 mg | ORAL_TABLET | Freq: Four times a day (QID) | ORAL | Status: DC | PRN

## 2018-04-24 MED ORDER — ZIPRASIDONE MESYLATE 20 MG IM SOLR: 20.0000 mg | Freq: Two times a day (BID) | INTRAMUSCULAR | Status: DC | PRN

## 2018-04-24 MED ORDER — ASENAPINE MALEATE 5 MG SL SUBL
5.0000 mg | SUBLINGUAL_TABLET | Freq: Two times a day (BID) | SUBLINGUAL | Status: DC
  Administered 2018-04-24 – 2018-04-28 (×9): 5 mg via SUBLINGUAL
  Filled 2018-04-24 (×11): qty 1

## 2018-04-24 NOTE — BHH Suicide Risk Assessment (Signed)
[facility] Admission Suicide Risk Assessment   Nursing information obtained from:  Patient Demographic factors:  Male, Caucasian Current Mental Status:  NA Loss Factors:  NA Historical Factors:  NA Risk Reduction Factors:  Sense of responsibility to family  Total Time spent with patient: 1 hour Principal Problem: Bipolar I disorder, single manic episode, severe, with psychosis (HCC) Diagnosis:   Patient Active Problem List   Diagnosis Date Noted  . Bipolar I disorder, single manic episode, severe, with psychosis (HCC) [F30.2] 04/24/2018   Subjective Data: see H&P  Continued Clinical Symptoms:  Alcohol Use Disorder Identification Test Final Score (AUDIT): 0 The "Alcohol Use Disorders Identification Test", Guidelines for Use in Primary Care, Second Edition.  World Science writer [facility]). Score between 0-7:  no or low risk or alcohol related problems. Score between 8-15:  moderate risk of alcohol related problems. Score between 16-19:  high risk of alcohol related problems. Score 20 or above:  warrants further diagnostic evaluation for alcohol dependence and treatment.   CLINICAL FACTORS:   Bipolar Disorder:   Mixed State   Psychiatric Specialty Exam: Physical Exam  Nursing note and vitals reviewed.     Blood pressure (!) 152/93, pulse 75, temperature 98.6 F (37 C), temperature source Oral, resp. rate 18, height 6\' 1"  (1.854 m), weight 83.9 kg, SpO2 98 %.Body mass index is 24.41 kg/m.     COGNITIVE FEATURES THAT CONTRIBUTE TO RISK:  None    SUICIDE RISK:   Minimal: No identifiable suicidal ideation.  Patients presenting with no risk factors but with morbid ruminations; may be classified as minimal risk based on the severity of the depressive symptoms  PLAN OF CARE: see H&P  I certify that inpatient services furnished can reasonably be expected to improve the patient's condition.   Micheal Likens, MD 04/24/2018, 12:50 PM

## 2018-04-24 NOTE — Progress Notes (Signed)
D: Pt denies SI/HI/AVH. Pt is pleasant and cooperative. Pt stated he was better, pt stated he needed to calm it down according to his family. Pt visible on the dayroom at times .   A: Pt was offered support and encouragement. Pt was given scheduled medications. Pt was encourage to attend groups. Q 15 minute checks were done for safety.   R: Pt is taking medication. Pt has no complaints.Pt receptive to treatment and safety maintained on unit.   Problem: Education: Goal: Emotional status will improve Outcome: Progressing   Problem: Education: Goal: Mental status will improve Outcome: Progressing   Problem: Activity: Goal: Sleeping patterns will improve Outcome: Progressing

## 2018-04-24 NOTE — Progress Notes (Signed)
Recreation Therapy Notes  Date: 10.31.19 Time: 1000 Location: 500 Hall Dayroom   Group Topic: Communication, Team Building, Problem Solving  Goal Area(s) Addresses:  Patient will effectively work with peer towards shared goal.  Patient will identify skill used to make activity successful.  Patient will identify how skills used during activity can be used to reach post d/c goals.   Behavioral Response: Engaged  Intervention: STEM Activity   Activity: [employer]. Patients were provided the following materials: 5 drinking straws, 5 rubber bands, 5 paper clips, 2 index cards, 2 drinking cups, and 2 toilet paper rolls. Using the provided materials patients were asked to build a launching mechanisms to launch a ping pong ball approximately 12 feet. Patients were divided into teams of 3-5.   Education: Pharmacist, community, Building control surveyor.   Education Outcome: Acknowledges education/In group clarification offered/Needs additional education.   Clinical Observations/Feedback: Pt was engaged and asked questions when he needed to for clarification with the activity.  Pt took on more of the leadership role with his group.  Pt was called out of group to meet with social worker and did not return.    [name], LRT/CTRS      [name] A 04/24/2018 11:34 AM

## 2018-04-24 NOTE — BHH Counselor (Signed)
Adult Comprehensive Assessment  Patient ID: Arthur Mitchell, male   DOB: Feb 19, 1981, 37 y.o.   MRN: [id number]  Information Source: Information source: Patient  Current Stressors:  Patient states their primary concerns and needs for treatment are:: Need to "draw myself back in" and slow my pace down.  Fix sleep pattern. Patient states their goals for this hospitilization and ongoing recovery are:: "to help my fellow patients" Educational / Learning stressors: Pt reports he is here fo rbeing "over-excited" My mind has been racing.  Business opportunities.   Bereavement / Loss: Pt reports his brother in law was just killed in [place].    Living/Environment/Situation:  Living Arrangements: Spouse/significant other, Children Living conditions (as described by patient or guardian): goes great, "never an argument" Who else lives in the home?: wife, 2 dauthers How long has patient lived in current situation?: 3 weeks  What is atmosphere in current home: Comfortable, Supportive  Family History:  Marital status: Married Number of Years Married: 17 What types of issues is patient dealing with in the relationship?: No issues.   Are you sexually active?: Yes What is your sexual orientation?: heterosexual Has your sexual activity been affected by drugs, alcohol, medication, or emotional stress?: no Does patient have children?: Yes How many children?: 2 How is patient's relationship with their children?: daughters ages 68,11.  Great relationships  Childhood History:  Additional childhood history information: Parents divorced when pt was 7.  Lived with mother initially, went with dad 7th grade, back to mom 9th grade.  Hard when parents divorced, also good things.   Description of patient's relationship with caregiver when they were a child: mom: wonderful, dad: great Patient's description of current relationship with people who raised him/her: mom and dad: good relationships How were you  disciplined when you got in trouble as a child/adolescent?: appropriate discipline Does patient have siblings?: Yes Number of Siblings: 3 Description of patient's current relationship with siblings: 2 older brothers, step sister.  Good relationships.   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?: No Are There Guns or Other Weapons in Your Home?: No  Financial Resources:   Financial resources: Income from employment, Private insurance Does patient have a representative payee or guardian?: No  Alcohol/Substance Abuse:   What has been your use of drugs/alcohol within the last 12 months?: alcohol: special occasions only.  marijuna: daily, 2 bowls daily, 2 years If attempted suicide, did drugs/alcohol play a role in this?: (na) Alcohol/Substance Abuse Treatment Hx: Denies past history Has alcohol/substance abuse ever caused legal problems?: Yes(paraphernalia charge 2000)  Social Support System:   Patient's Community Support System: Good Describe Community Support System: wife, family Type of faith/religion: none, "but I'm very  spiritual" How does patient's faith help to cope with current illness?: na  Leisure/Recreation:   Leisure and Hobbies: manual labor: yard work, gardening, English as a second language teacher, time with family  Strengths/Needs:   What is the patient's perception of their strengths?: ability to be very pragmatic Patient states they can use these personal strengths during their treatment to contribute to their recovery: Pt willing to look at his current manic state to address it.  Willing to try medicaiton. Patient states these barriers may affect/interfere with their treatment: none Patient states these barriers may affect their return to the community: none Other important information patient would like considered in planning for their treatment: "I love life"  Discharge Plan:   Currently receiving community mental health services: No Patient states concerns and preferences for aftercare planning are: Willing to follow up at discharge Patient states they will know when they are safe and ready for discharge when: I'm ready to leave now Does patient have access to transportation?: Yes Does patient have financial barriers related to discharge medications?: No Will patient be returning to same living situation after discharge?: Yes  Summary/Recommendations:   Summary and Recommendations (to be completed by the evaluator): Pt is 37 year old male from [address] [facility] [place]).  Pt is diagnosed with bipolar disorder and was admitted due to manic behaviors.  Recommendations for pt include crisis stabilization, therapeutic milieu, attend and participate in groups, medication management, and development of comprehensive mental wellness plan.  Lorri Frederick. 04/24/2018

## 2018-04-24 NOTE — H&P (Signed)
Psychiatric Admission Assessment Adult  Patient Identification: Arthur Mitchell MRN:  [id number] Date of Evaluation:  04/24/2018 Chief Complaint:  BIPOLAR 1 DISORDER Principal Diagnosis: Bipolar I disorder, single manic episode, severe, with psychosis (HCC) Diagnosis:   Patient Active Problem List   Diagnosis Date Noted  . Bipolar I disorder, single manic episode, severe, with psychosis (HCC) [F30.2] 04/24/2018   History of Present Illness:   Arthur Mitchell is a 37 y/o M with history of treatment for ADHD who was admitted from [facility] on IVC initiated in the ED after being brought to [facility] as a walk-in by his family with concern for worsening symptoms of grandiosity, flight of ideas, decreased need for sleep, pressured speech, and elevated mood. Pt was medically cleared and then transferred to [facility] for additional treatment and stabilization.  Upon initial interview, pt is pressured, euphoric, and has intense stare. He is generally cooperative with interview, but he is circumstantial with his responses and often requires redirection to stay on topic. When asked why he was brought to the hospital, pt shares convoluted story about coming to recent realization that he wants to quit his job and "build a vision" akin to an amusement park like [place] "for the enjoyment of all." He called his brother with whom he has rare contact to explain about his investment opportunity and made promises of making millions of dollars, and pt thinks that triggered concern from his family members. 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 He has some insight that his current state is elevated and his rate of thinking has increased. He denies symptoms of depression. He has been sleeping about 3-6 hours per night, and he states that is his typical baseline. He denies SI/HI/AH/VH. He endorses some  distractibility, flight of ideas, increased activities, and pressured speech. He denies thoughtlessness behaviors, but as per intake history pt has reportedly been giving away thousands of dollars in merchandise/cash. He denies symptoms of OCD and PTSD. He drinks about 5x per year and consumes 2-3 alcoholic beverages on those occassions. He uses cannabis twice daily, but he otherwise denies all other illicit substance use including tobacco.  Discussed with patient about treatment plan. He recently has been taking adderall at home, and he thinks that his dose is 15-20mg  po BID. He also takes Clomid (unknown dose) for low testosterone. Discussed with patient that adderall may be contributing to his current presentation and he would likely benefit from discontinuing that medication. Pt was in agreement, and he also agrees to trial of lithium and saphris for mood stabilization. Pt was in agreement with the above plan, and he had no further questions, comments, or concerns.  Associated Signs/Symptoms: Depression Symptoms:  insomnia, (Hypo) Manic Symptoms:  Delusions, Distractibility, Elevated Mood, Flight of Ideas, Licensed conveyancer, Grandiosity, Impulsivity, Labiality of Mood, Anxiety Symptoms:  NA Psychotic Symptoms:  Delusions, PTSD Symptoms: NA Total Time spent with patient: 1 hour  Past Psychiatric History:  - previous treatment for ADHD - no hx of inpatient hospitalization - no current outpatient provider - no hx of suicide attempt  Is the patient at risk to self? Yes.    Has the patient been a risk to self in the past 6 months? Yes.    Has the patient been a risk to self within the distant past? Yes.    Is the patient a risk to others? Yes.  Has the patient been a risk to others in the past 6 months? Yes.    Has the patient been a risk to others within the distant past? Yes.     Prior Inpatient Therapy:   Prior Outpatient Therapy:    Alcohol Screening: 1. How often do you  have a drink containing alcohol?: Never 2. How many drinks containing alcohol do you have on a typical day when you are drinking?: 1 or 2 3. How often do you have six or more drinks on one occasion?: Never AUDIT-C Score: 0 4. How often during the last year have you found that you were not able to stop drinking once you had started?: Never 5. How often during the last year have you failed to do what was normally expected from you becasue of drinking?: Never 6. How often during the last year have you needed a first drink in the morning to get yourself going after a heavy drinking session?: Never 7. How often during the last year have you had a feeling of guilt of remorse after drinking?: Never 8. How often during the last year have you been unable to remember what happened the night before because you had been drinking?: Never 9. Have you or someone else been injured as a result of your drinking?: No 10. Has a relative or friend or a doctor or another health worker been concerned about your drinking or suggested you cut down?: No Alcohol Use Disorder Identification Test Final Score (AUDIT): 0 Intervention/Follow-up: AUDIT Score <7 follow-up not indicated Substance Abuse History in the last 12 months:  Yes.   Consequences of Substance Abuse: Medical Consequences:  worsened mood and psychotic symptoms Previous Psychotropic Medications: Yes  Psychological Evaluations: Yes  Past Medical History: History reviewed. No pertinent past medical history.  Past Surgical History:  Procedure Laterality Date  . ORTHOPEDIC SURGERY     Family History: History reviewed. No pertinent family history. Family Psychiatric  History: mother hx of depression. Tobacco Screening: Have you used any form of tobacco in the last 30 days? (Cigarettes, Smokeless Tobacco, Cigars, and/or Pipes): No Social History: Pt was born and raised in the [place] area, and he currently lives in [place] with his wife and 2 daughters  (ages 38 and 5). He completed his bachelor's degree and he works in Insurance account manager. He has legal history of cannabis possession. He denies trauma history. Social History   Substance and Sexual Activity  Alcohol Use Yes   Comment: occ     Social History   Substance and Sexual Activity  Drug Use No    Additional Social History: Marital status: Married Number of Years Married: 17 What types of issues is patient dealing with in the relationship?: No issues.   Are you sexually active?: Yes What is your sexual orientation?: heterosexual Has your sexual activity been affected by drugs, alcohol, medication, or emotional stress?: no Does patient have children?: Yes How many children?: 2 How is patient's relationship with their children?: daughters ages 19,11.  Great relationships                         Allergies:  No Known Allergies Lab Results:  Results for orders placed or performed during the hospital encounter of 04/23/18 (from the past 48 hour(s))  Hemoglobin A1c     Status: Abnormal   Collection Time: 04/24/18  6:27 AM  Result Value Ref Range   Hgb A1c MFr Bld 4.5 (L) 4.8 -  5.6 %    Comment: (NOTE) Pre diabetes:          5.7%-6.4% Diabetes:              >6.4% Glycemic control for   <7.0% adults with diabetes    Mean Plasma Glucose 82.45 mg/dL    Comment: Performed at [facility], [address], [place], [place] [address]  Hepatic function panel     Status: Abnormal   Collection Time: 04/24/18  6:27 AM  Result Value Ref Range   Total Protein 7.5 6.5 - 8.1 g/dL   Albumin 4.5 3.5 - 5.0 g/dL   AST 26 15 - 41 U/L   ALT 29 0 - 44 U/L   Alkaline Phosphatase 76 38 - 126 U/L   Total Bilirubin 2.2 (H) 0.3 - 1.2 mg/dL   Bilirubin, Direct 0.3 (H) 0.0 - 0.2 mg/dL   Indirect Bilirubin 1.9 (H) 0.3 - 0.9 mg/dL    Comment: Performed at [facility], [address] [place], [place] [address]  Lipid panel     Status: Abnormal   Collection Time:  04/24/18  6:27 AM  Result Value Ref Range   Cholesterol 145 0 - 200 mg/dL   Triglycerides 85 <469 mg/dL   HDL 29 (L) >62 mg/dL   Total CHOL/HDL Ratio 5.0 RATIO   VLDL 17 0 - 40 mg/dL   LDL Cholesterol 99 0 - 99 mg/dL    Comment:        Total Cholesterol/HDL:CHD Risk Coronary Heart Disease Risk Table                     Men   Women  1/2 Average Risk   3.4   3.3  Average Risk       5.0   4.4  2 X Average Risk   9.6   7.1  3 X Average Risk  23.4   11.0        Use the calculated Patient Ratio above and the CHD Risk Table to determine the patient's CHD Risk.        ATP III CLASSIFICATION (LDL):  <100     mg/dL   Optimal  952-841  mg/dL   Near or Above                    Optimal  130-159  mg/dL   Borderline  324-401  mg/dL   High  >027     mg/dL   Very High Performed at [facility], [address] [place], [place] [address]   TSH     Status: None   Collection Time: 04/24/18  6:27 AM  Result Value Ref Range   TSH 2.190 0.350 - 4.500 uIU/mL    Comment: Performed by a 3rd Generation assay with a functional sensitivity of <=0.01 uIU/mL. Performed at [facility], [address] [place], [place] [address]     Blood Alcohol level:  Lab Results  Component Value Date   ETH <10 04/23/2018    Metabolic Disorder Labs:  Lab Results  Component Value Date   HGBA1C 4.5 (L) 04/24/2018   MPG 82.45 04/24/2018   No results found for: PROLACTIN Lab Results  Component Value Date   CHOL 145 04/24/2018   TRIG 85 04/24/2018   HDL 29 (L) 04/24/2018   CHOLHDL 5.0 04/24/2018   VLDL 17 04/24/2018   LDLCALC 99 04/24/2018    Current Medications: Current  Facility-Administered Medications  Medication Dose Route Frequency Provider Last Rate Last Dose  . acetaminophen (TYLENOL) tablet 650 mg  650 mg Oral Q6H PRN Oneta Rack, NP      . alum & mag hydroxide-simeth (MAALOX/MYLANTA) 200-200-20 MG/5ML suspension 30 mL  30 mL Oral Q4H PRN Oneta Rack, NP      . asenapine (SAPHRIS) sublingual tablet 5 mg  5 mg Sublingual BID Jolyne Loa T, MD   5 mg at 04/24/18 1212  . hydrOXYzine (ATARAX/VISTARIL) tablet 50 mg  50 mg Oral Q6H PRN Micheal Likens, MD      . lithium carbonate (LITHOBID) CR tablet 300 mg  300 mg Oral Q12H Micheal Likens, MD   300 mg at 04/24/18 1212  . LORazepam (ATIVAN) tablet 1 mg  1 mg Oral Q6H PRN Micheal Likens, MD       Or  . LORazepam (ATIVAN) injection 1 mg  1 mg Intramuscular Q6H PRN Micheal Likens, MD      . magnesium hydroxide (MILK OF MAGNESIA) suspension 30 mL  30 mL Oral Daily PRN Oneta Rack, NP      . OLANZapine zydis (ZYPREXA) disintegrating tablet 5 mg  5 mg Oral Q8H PRN Micheal Likens, MD       Or  . ziprasidone (GEODON) injection 20 mg  20 mg Intramuscular Q12H PRN Micheal Likens, MD      . traZODone (DESYREL) tablet 50 mg  50 mg Oral QHS PRN Oneta Rack, NP       PTA Medications: Medications Prior to Admission  Medication Sig Dispense Refill Last Dose  . amphetamine-dextroamphetamine (ADDERALL) 20 MG tablet Take 20 mg by mouth 2 (two) times daily.   Past Week at Unknown time  . clomiPHENE (CLOMID) 50 MG tablet Take 25 mg by mouth daily.    04/22/2018 at Unknown time    Musculoskeletal: Strength & Muscle Tone: within normal limits Gait & Station: normal Patient leans: N/A  Psychiatric Specialty Exam: Physical Exam  Nursing note and vitals reviewed.   Review of Systems  Constitutional: Negative for chills and fever.  Respiratory: Negative for cough and shortness of breath.   Cardiovascular: Negative for chest pain.  Gastrointestinal: Negative for abdominal pain, heartburn, nausea and vomiting.  Psychiatric/Behavioral: Negative for depression, hallucinations and suicidal ideas. The patient is not nervous/anxious and does not have insomnia.     Blood pressure (!) 152/93, pulse 75, temperature 98.6 F (37 C),  temperature source Oral, resp. rate 18, height 6\' 1"  (1.854 m), weight 83.9 kg, SpO2 98 %.Body mass index is 24.41 kg/m.  General Appearance: Casual and Fairly Groomed  Eye Contact:  Good  Speech:  Clear and Coherent and Pressured  Volume:  Normal  Mood:  Euphoric  Affect:  Congruent  Thought Process:  Coherent, Goal Directed and Descriptions of Associations: Circumstantial  Orientation:  Full (Time, Place, and Person)  Thought Content:  Delusions and Tangential  Suicidal Thoughts:  No  Homicidal Thoughts:  No  Memory:  Immediate;   Fair Recent;   Fair Remote;   Fair  Judgement:  Fair  Insight:  Lacking  Psychomotor Activity:  Normal  Concentration:  Concentration: Fair  Recall:  [employer] of Knowledge:  Fair  Language:  Fair  Akathisia:  No  Handed:    AIMS (if indicated):     Assets:  Communication Skills Desire for Improvement Financial Resources/Insurance Housing Physical Health Resilience Social Support Transportation Vocational/Educational  ADL's:  Intact  Cognition:  WNL  Sleep:  Number of Hours: 6.75    Treatment Plan Summary: Daily contact with patient to assess and evaluate symptoms and progress in treatment and Medication management  Observation Level/Precautions:  15 minute checks  Laboratory:  CBC Chemistry Profile HbAIC UDS UA  Psychotherapy:  Encourage participation in groups and therapeutic milieu   Medications:  Start lithium CR 300mg  po BID. Start saphris 5mg  SL BID. Continue all other current orders/PRN's without changes - see MAR  Consultations:    Discharge Concerns:    Estimated LOS: 5-7 days   Other:     Physician Treatment Plan for Primary Diagnosis: Bipolar I disorder, single manic episode, severe, with psychosis (HCC) Long Term Goal(s): Improvement in symptoms so as ready for discharge  Short Term Goals: Ability to identify and develop effective coping behaviors will improve  Physician Treatment Plan for Secondary Diagnosis:  Principal Problem:   Bipolar I disorder, single manic episode, severe, with psychosis (HCC)  Long Term Goal(s): Improvement in symptoms so as ready for discharge  Short Term Goals: Ability to maintain clinical measurements within normal limits will improve  I certify that inpatient services furnished can reasonably be expected to improve the patient's condition.    Micheal Likens, MD 10/31/201912:38 PM

## 2018-04-24 NOTE — Plan of Care (Signed)
  Problem: Activity: Goal: Interest or engagement in activities will improve Outcome: Progressing   Problem: Safety: Goal: Periods of time without injury will increase Outcome: Progressing  DAR NOTE: Patient presents with calm affect and depressed mood.  Denies suicidal thoughts, pain, auditory and visual hallucinations.  Described energy level as normal and concentration as good.  Rates depression at 0, hopelessness at 0, and anxiety at 0.  Maintained on routine safety checks.  Medications given as prescribed.  Support and encouragement offered as needed.  Attended group and participated.  States goal for today is "continue to grow as a person."  Patient observed socializing with peers in the dayroom.  Offered no complaint.

## 2018-04-24 NOTE — Progress Notes (Signed)
Adult Psychoeducational Group Note  Date:  04/24/2018 Time:  8:48 PM  Group Topic/Focus:  Wrap-Up Group:   The focus of this group is to help patients review their daily goal of treatment and discuss progress on daily workbooks.  Participation Level:  Active  Participation Quality:  Appropriate  Affect:  Appropriate  Cognitive:  Appropriate  Insight: Appropriate  Engagement in Group:  Engaged  Modes of Intervention:  Discussion  Additional Comments:  The patient expressed that he rates today a 10.The patient also said that he attended groups and achieved his goal.  Arthur Mitchell 04/24/2018, 8:48 PM

## 2018-04-24 NOTE — Progress Notes (Signed)
Recreation Therapy Notes  INPATIENT RECREATION THERAPY ASSESSMENT  Patient Details Name: RUDI BUNYARD MRN: [id number] DOB: 03/12/1981 Today's Date: 04/24/2018       Information Obtained From: Patient  Able to Participate in Assessment/Interview: Yes  Patient Presentation: Alert, Hyperverbal  Reason for Admission (Per Patient): Other (Comments)(Pt stated his family was worried about his change in behavior)  Patient Stressors: (Pt stated he had no stressors)  Coping Skills:   Journal, Sports, TV, Music, Exercise, Talk  Leisure Interests (2+):  [employer] care, Social - Family, Technical brewer - Other (Comment)(Be in nature)  Frequency of Recreation/Participation: Other (Comment)(Pt stated she went on vacation yearly; Everything else daily)  Awareness of Community Resources:  Yes  Community Resources:  Park, Other (Comment)([place])  Current Use: Yes  If no, Barriers?:    Expressed Interest in [employer] Information: No  [place]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

## 2018-04-24 NOTE — [facility] Group Notes (Signed)
[facility] Group Notes:  (Nursing/MHT/Case Management/Adjunct)  Date:  04/24/2018  Time:  6:10 PM  Type of Therapy:  Psychoeducational Skills  Participation Level:  Did Not Attend   Arthur Mitchell 04/24/2018, 6:10 PM

## 2018-04-24 NOTE — [facility] Group Notes (Signed)
[facility] LCSW Group Therapy Note  Date/Time: 04/24/18, 1345  Type of Therapy/Topic:  Group Therapy:  Balance in Life  Participation Level:  minimal  Description of Group:    This group will address the concept of balance and how it feels and looks when one is unbalanced. Patients will be encouraged to process areas in their lives that are out of balance, and identify reasons for remaining unbalanced. Facilitators will guide patients utilizing problem- solving interventions to address and correct the stressor making their life unbalanced. Understanding and applying boundaries will be explored and addressed for obtaining  and maintaining a balanced life. Patients will be encouraged to explore ways to assertively make their unbalanced needs known to significant others in their lives, using other group members and facilitator for support and feedback.  Therapeutic Goals: 1. Patient will identify two or more emotions or situations they have that consume much of in their lives. 2. Patient will identify signs/triggers that life has become out of balance:  3. Patient will identify two ways to set boundaries in order to achieve balance in their lives:  4. Patient will demonstrate ability to communicate their needs through discussion and/or role plays  Summary of Patient Progress: Pt identified work and finances as areas that are out of balance in her life.  Pt mostly quiet during the group, he did respond to CSW questions when asked but had his head down for much of the time.          Therapeutic Modalities:   Cognitive Behavioral Therapy Solution-Focused Therapy Assertiveness Training  Daleen Squibb, [place]

## 2018-04-25 LAB — PROLACTIN: Prolactin: 27.3 ng/mL — ABNORMAL HIGH (ref 4.0–15.2)

## 2018-04-25 NOTE — Tx Team (Addendum)
Interdisciplinary Treatment and Diagnostic Plan Update  04/25/2018 Time of Session: 0950 Arthur Mitchell MRN: [id number]  Principal Diagnosis: Bipolar I disorder, single manic episode, severe, with psychosis ([place])  Secondary Diagnoses: Principal Problem:   Bipolar I disorder, single manic episode, severe, with psychosis ([place])   Current Medications:  Current Facility-Administered Medications  Medication Dose Route Frequency Provider Last Rate Last Dose  . acetaminophen (TYLENOL) tablet 650 mg  650 mg Oral Q6H PRN Derrill Center, NP      . alum & mag hydroxide-simeth (MAALOX/MYLANTA) 200-200-20 MG/5ML suspension 30 mL  30 mL Oral Q4H PRN Derrill Center, NP      . asenapine (SAPHRIS) sublingual tablet 5 mg  5 mg Sublingual BID Pennelope Bracken, MD   5 mg at 04/25/18 0745  . hydrOXYzine (ATARAX/VISTARIL) tablet 50 mg  50 mg Oral Q6H PRN Pennelope Bracken, MD      . lithium carbonate (LITHOBID) CR tablet 300 mg  300 mg Oral Q12H Pennelope Bracken, MD   300 mg at 04/25/18 0745  . LORazepam (ATIVAN) tablet 1 mg  1 mg Oral Q6H PRN Pennelope Bracken, MD       Or  . LORazepam (ATIVAN) injection 1 mg  1 mg Intramuscular Q6H PRN Pennelope Bracken, MD      . magnesium hydroxide (MILK OF MAGNESIA) suspension 30 mL  30 mL Oral Daily PRN Derrill Center, NP      . OLANZapine zydis (ZYPREXA) disintegrating tablet 5 mg  5 mg Oral Q8H PRN Pennelope Bracken, MD       Or  . ziprasidone (GEODON) injection 20 mg  20 mg Intramuscular Q12H PRN Pennelope Bracken, MD      . traZODone (DESYREL) tablet 50 mg  50 mg Oral QHS PRN Derrill Center, NP       PTA Medications: Medications Prior to Admission  Medication Sig Dispense Refill Last Dose  . amphetamine-dextroamphetamine (ADDERALL) 20 MG tablet Take 20 mg by mouth 2 (two) times daily.   Past Week at Unknown time  . clomiPHENE (CLOMID) 50 MG tablet Take 25 mg by mouth daily.    04/22/2018 at Unknown time     Patient Stressors: Medication change or noncompliance  Patient Strengths: Supportive family/friends  Treatment Modalities: Medication Management, Group therapy, Case management,  1 to 1 session with clinician, Psychoeducation, Recreational therapy.   Physician Treatment Plan for Primary Diagnosis: Bipolar I disorder, single manic episode, severe, with psychosis ([place]) Long Term Goal(s): Improvement in symptoms so as ready for discharge Improvement in symptoms so as ready for discharge   Short Term Goals: Ability to identify and develop effective coping behaviors will improve Ability to maintain clinical measurements within normal limits will improve  Medication Management: Evaluate patient's response, side effects, and tolerance of medication regimen.  Therapeutic Interventions: 1 to 1 sessions, Unit Group sessions and Medication administration.  Evaluation of Outcomes: Progressing  Physician Treatment Plan for Secondary Diagnosis: Principal Problem:   Bipolar I disorder, single manic episode, severe, with psychosis ([place])  Long Term Goal(s): Improvement in symptoms so as ready for discharge Improvement in symptoms so as ready for discharge   Short Term Goals: Ability to identify and develop effective coping behaviors will improve Ability to maintain clinical measurements within normal limits will improve     Medication Management: Evaluate patient's response, side effects, and tolerance of medication regimen.  Therapeutic Interventions: 1 to 1 sessions, Unit Group sessions and Medication administration.  Evaluation of Outcomes: Progressing   RN Treatment Plan for Primary Diagnosis: Bipolar I disorder, single manic episode, severe, with psychosis ([place]) Long Term Goal(s): Knowledge of disease and therapeutic regimen to maintain health will improve  Short Term Goals: Ability to identify and develop effective coping behaviors will improve and Compliance with prescribed  medications will improve  Medication Management: RN will administer medications as ordered by provider, will assess and evaluate patient's response and provide education to patient for prescribed medication. RN will report any adverse and/or side effects to prescribing provider.  Therapeutic Interventions: 1 on 1 counseling sessions, Psychoeducation, Medication administration, Evaluate responses to treatment, Monitor vital signs and CBGs as ordered, Perform/monitor CIWA, COWS, AIMS and Fall Risk screenings as ordered, Perform wound care treatments as ordered.  Evaluation of Outcomes: Progressing   LCSW Treatment Plan for Primary Diagnosis: Bipolar I disorder, single manic episode, severe, with psychosis ([place]) Long Term Goal(s): Safe transition to appropriate next level of care at discharge, Engage patient in therapeutic group addressing interpersonal concerns.  Short Term Goals: Engage patient in aftercare planning with referrals and resources, Increase social support and Increase skills for wellness and recovery  Therapeutic Interventions: Assess for all discharge needs, 1 to 1 time with Social worker, Explore available resources and support systems, Assess for adequacy in community support network, Educate family and significant other(s) on suicide prevention, Complete Psychosocial Assessment, Interpersonal group therapy.  Evaluation of Outcomes: Not Met   Progress in Treatment: Attending groups: Yes. Participating in groups: Yes. Taking medication as prescribed: Yes. Toleration medication: Yes. Family/Significant other contact made: No, will contact:  wife Patient understands diagnosis: Yes. Discussing patient identified problems/goals with staff: Yes. Medical problems stabilized or resolved: Yes. Denies suicidal/homicidal ideation: Yes. Issues/concerns per patient self-inventory: No. Other: none  New problem(s) identified: No, Describe:  none  New Short Term/Long Term  Goal(s):  Patient Goals:  "get in a position to go home"  Discharge Plan or Barriers:   Reason for Continuation of Hospitalization: Mania Medication stabilization  Estimated Length of Stay: 2-4 days.  Attendees: Patient:Arthur Mitchell 04/25/2018   Physician: Dr. Nancy Fetter, MD 04/25/2018   Nursing: Neldon Newport, RN 04/25/2018   RN Care Manager: 04/25/2018   Social Worker: Lurline Idol, LCSW 04/25/2018   Recreational Therapist:  04/25/2018  Other:  04/25/2018   Other:  04/25/2018   Other: 04/25/2018        Scribe for Treatment Team: Joanne Chars, LCSW 04/25/2018 1:25 PM

## 2018-04-25 NOTE — [facility] Group Notes (Signed)
Date: 04/25/18, 1315  Type of Therapy and Topic: Chaplain group, "Hope is.." Chaplain engaged group in discussion about hope and what it looks like in each patients life and current situation.  Participation level:active  Modes of Intervention: Discussion, Education and Socialization  Summary of Progress/Problems: Pt very active in discussion, multiple comments made and often carrying on dialogue directly with chaplain.  Pt continues to talk about his family and his desire to be with them.     Lorri Frederick, LCSW 04/25/2018 1:36 PM  [facility] LCSW Group Therapy Note

## 2018-04-25 NOTE — [facility] Suicide Risk Assessment (Signed)
[facility] INPATIENT:  Family/Significant Other Suicide Prevention Education  Suicide Prevention Education:  Education Completed; William Schake, wife, [phone], has been identified by the patient as the family member/significant other with whom the patient will be residing, and identified as the person(s) who will aid the patient in the event of a mental health crisis (suicidal ideations/suicide attempt).  With written consent from the patient, the family member/significant other has been provided the following suicide prevention education, prior to the and/or following the discharge of the patient.  The suicide prevention education provided includes the following:  Suicide risk factors  Suicide prevention and interventions  National Suicide Hotline telephone number  [facility] assessment telephone number  [facility] Emergency Assistance 911  [facility] and/or Residential Mobile Crisis Unit telephone number  Request made of family/significant other to:  Remove weapons (e.g., guns, rifles, knives), all items previously/currently identified as safety concern. No guns are in the home according to [place].   Remove drugs/medications (over-the-counter, prescriptions, illicit drugs), all items previously/currently identified as a safety concern.  Malachi Bonds stated that she and her husband purchased a home to renovate with contractors. On April 13, 2018, after not sleeping for a couple days, Levander fired [employer]; and, he began recreating the plans, Malachi Bonds reported. Over the next few days he purchased $35,000 in furniture, she added. Malachi Bonds said that this behavior is unlike him. Malachi Bonds also stated that Sultan has been concerned about his mom who has been depressed for many years and has "trashed her last 3 homes because she hoards everything''.  The family member/significant other verbalizes understanding of the suicide prevention education information provided.  The family  member/significant other agrees to remove the items of safety concern listed above.  Cherie Bohaboy 04/25/2018, 3:47 PM

## 2018-04-25 NOTE — Progress Notes (Signed)
Arthur Mitchell requested to speak with chaplain following group.   Loquacious with slightly pressured speech and tangential thoughts.  He spoke about the death of his brother in law and feeling as though he had a moment of clarity around "what matters to me."  Described desires to build businesses and grow his town of [place].  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   Recognizes that others are seeing elevated energy levels and states "I've always thought these things, I just have not said them."  Has some insight into presenting differently.  Has good rapport with his MD and care team and though he states he feels "trapped," he is in agreement that [facility] is a good place for him right now.  Had a visit from his spouse and daughters last evening and felt this was very helpful.   Chaplain provided supportive presence, worked to increase rapport with pt and care team.

## 2018-04-25 NOTE — Progress Notes (Signed)
[facility] MD Progress Note  04/25/2018 12:48 PM CHI GARLOW  MRN:  [id number] Subjective:    History as per psychiatric intake: Arthur Mitchell is a 37 y/o M with history of treatment for ADHD who was admitted from [facility] on IVC initiated in the ED after being brought to [facility] as a walk-in by his family with concern for worsening symptoms of grandiosity, flight of ideas, decreased need for sleep, pressured speech, and elevated mood. Pt was medically cleared and then transferred to [facility] for additional treatment and stabilization. Upon initial interview, pt is pressured, euphoric, and has intense stare. He is generally cooperative with interview, but he is circumstantial with his responses and often requires redirection to stay on topic. When asked why he was brought to the hospital, pt shares convoluted story about coming to recent realization that he wants to quit his job and "build a vision" akin to an amusement park like [employer] "for the enjoyment of all." He called his brother with whom he has rare contact to explain about his investment opportunity and made promises of making millions of dollars, and pt thinks that triggered concern from his family members. 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 He has some insight that his current state is elevated and his rate of thinking has increased. He denies symptoms of depression. He has been sleeping about 3-6 hours per night, and he states that is his typical baseline. He denies SI/HI/AH/VH. He endorses some distractibility, flight of ideas, increased activities, and pressured speech. He denies thoughtlessness behaviors, but as per intake history pt has reportedly been giving away thousands of dollars in merchandise/cash. He denies symptoms of OCD and PTSD. He drinks about 5x per year and consumes 2-3 alcoholic beverages on those  occassions. He uses cannabis twice daily, but he otherwise denies all other illicit substance use including tobacco. Discussed with patient about treatment plan. He recently has been taking adderall at home, and he thinks that his dose is 15-20mg  po BID. He also takes Clomid (unknown dose) for low testosterone. Discussed with patient that adderall may be contributing to his current presentation and he would likely benefit from discontinuing that medication. Pt was in agreement, and he also agrees to trial of lithium and saphris for mood stabilization. Pt was in agreement with the above plan, and he had no further questions, comments, or concerns.  As per evaluation today: Today upon evaluation, pt shares, "I'm doing so great." Pt continues to have pressured speech, tangential thought process, and mood lability. His mood continues to be euphoric and he is joyfully tearful at times during interview when discussing topics of his children and his dog. He denies any specific concerns. He is sleeping well. His appetite is good. He denies other physical complaints. He denies SI/HI/AH/VH. He is tolerating his medications well, and he is in agreement to continue his current regimen without changes. Discussed with patient that we will plan to continue to monitor his symptoms of mania and plan to draw a lithium level after the weekend. Pt would like to discharge as soon as possible, but he receptive to observation that he is still having symptoms of mania and will need additional time to stabilize his mood. He is in agreement with the above plan, and he had no further questions, comments, or concerns.  Principal Problem: Bipolar I disorder, single manic episode, severe,  with psychosis ([facility]) Diagnosis:   Patient Active Problem List   Diagnosis Date Noted  . Bipolar I disorder, single manic episode, severe, with psychosis ([facility]) [F30.2] 04/24/2018   Total Time spent with patient: 30 minutes  Past Psychiatric  History: see H&P  Past Medical History: History reviewed. No pertinent past medical history.  Past Surgical History:  Procedure Laterality Date  . ORTHOPEDIC SURGERY     Family History: History reviewed. No pertinent family history. Family Psychiatric  History: see H&P Social History:  Social History   Substance and Sexual Activity  Alcohol Use Yes   Comment: occ     Social History   Substance and Sexual Activity  Drug Use No    Social History   Socioeconomic History  . Marital status: Married    Spouse name: Not on file  . Number of children: Not on file  . Years of education: Not on file  . Highest education level: Not on file  Occupational History  . Not on file  Social Needs  . Financial resource strain: Not on file  . Food insecurity:    Worry: Not on file    Inability: Not on file  . Transportation needs:    Medical: Not on file    Non-medical: Not on file  Tobacco Use  . Smoking status: Never Smoker  . Smokeless tobacco: Never Used  Substance and Sexual Activity  . Alcohol use: Yes    Comment: occ  . Drug use: No  . Sexual activity: Not on file  Lifestyle  . Physical activity:    Days per week: Not on file    Minutes per session: Not on file  . Stress: Not on file  Relationships  . Social connections:    Talks on phone: Not on file    Gets together: Not on file    Attends religious service: Not on file    Active member of club or organization: Not on file    Attends meetings of clubs or organizations: Not on file    Relationship status: Not on file  Other Topics Concern  . Not on file  Social History Narrative  . Not on file   Additional Social History:                         Sleep: Good  Appetite:  Good  Current Medications: Current Facility-Administered Medications  Medication Dose Route Frequency Provider Last Rate Last Dose  . acetaminophen (TYLENOL) tablet 650 mg  650 mg Oral Q6H PRN Oneta Rack, NP      . alum &  mag hydroxide-simeth (MAALOX/MYLANTA) 200-200-20 MG/5ML suspension 30 mL  30 mL Oral Q4H PRN Oneta Rack, NP      . asenapine (SAPHRIS) sublingual tablet 5 mg  5 mg Sublingual BID Micheal Likens, MD   5 mg at 04/25/18 0745  . hydrOXYzine (ATARAX/VISTARIL) tablet 50 mg  50 mg Oral Q6H PRN Micheal Likens, MD      . lithium carbonate (LITHOBID) CR tablet 300 mg  300 mg Oral Q12H Micheal Likens, MD   300 mg at 04/25/18 0745  . LORazepam (ATIVAN) tablet 1 mg  1 mg Oral Q6H PRN Micheal Likens, MD       Or  . LORazepam (ATIVAN) injection 1 mg  1 mg Intramuscular Q6H PRN Micheal Likens, MD      . magnesium hydroxide (MILK OF MAGNESIA) suspension 30 mL  30 mL Oral Daily PRN Oneta Rack, NP      . OLANZapine zydis (ZYPREXA) disintegrating tablet 5 mg  5 mg Oral Q8H PRN Micheal Likens, MD       Or  . ziprasidone (GEODON) injection 20 mg  20 mg Intramuscular Q12H PRN Micheal Likens, MD      . traZODone (DESYREL) tablet 50 mg  50 mg Oral QHS PRN Oneta Rack, NP        Lab Results:  Results for orders placed or performed during the hospital encounter of 04/23/18 (from the past 48 hour(s))  Hemoglobin A1c     Status: Abnormal   Collection Time: 04/24/18  6:27 AM  Result Value Ref Range   Hgb A1c MFr Bld 4.5 (L) 4.8 - 5.6 %    Comment: (NOTE) Pre diabetes:          5.7%-6.4% Diabetes:              >6.4% Glycemic control for   <7.0% adults with diabetes    Mean Plasma Glucose 82.45 mg/dL    Comment: Performed at [facility], [address] [place], [place] [address]  Hepatic function panel     Status: Abnormal   Collection Time: 04/24/18  6:27 AM  Result Value Ref Range   Total Protein 7.5 6.5 - 8.1 g/dL   Albumin 4.5 3.5 - 5.0 g/dL   AST 26 15 - 41 U/L   ALT 29 0 - 44 U/L   Alkaline Phosphatase 76 38 - 126 U/L   Total Bilirubin 2.2 (H) 0.3 - 1.2 mg/dL   Bilirubin, Direct 0.3 (H) 0.0 - 0.2 mg/dL   Indirect  Bilirubin 1.9 (H) 0.3 - 0.9 mg/dL    Comment: Performed at [facility], [address], [place], [place] [address]  Lipid panel     Status: Abnormal   Collection Time: 04/24/18  6:27 AM  Result Value Ref Range   Cholesterol 145 0 - 200 mg/dL   Triglycerides 85 <657 mg/dL   HDL 29 (L) >84 mg/dL   Total CHOL/HDL Ratio 5.0 RATIO   VLDL 17 0 - 40 mg/dL   LDL Cholesterol 99 0 - 99 mg/dL    Comment:        Total Cholesterol/HDL:CHD Risk Coronary Heart Disease Risk Table                     Men   Women  1/2 Average Risk   3.4   3.3  Average Risk       5.0   4.4  2 X Average Risk   9.6   7.1  3 X Average Risk  23.4   11.0        Use the calculated Patient Ratio above and the CHD Risk Table to determine the patient's CHD Risk.        ATP III CLASSIFICATION (LDL):  <100     mg/dL   Optimal  696-295  mg/dL   Near or Above                    Optimal  130-159  mg/dL   Borderline  284-132  mg/dL   High  >440     mg/dL   Very High Performed at [facility], [address], [place], [place] [address]   Prolactin     Status: Abnormal   Collection Time: 04/24/18  6:27 AM  Result Value Ref Range   Prolactin 27.3 (H) 4.0 - 15.2 ng/mL    Comment: (NOTE) Performed At: [facility] [address] [place], [place] [id number] Jolene Schimke MD WU:[id number]   TSH     Status: None   Collection Time: 04/24/18  6:27 AM  Result Value Ref Range   TSH 2.190 0.350 - 4.500 uIU/mL    Comment: Performed by a 3rd Generation assay with a functional sensitivity of <=0.01 uIU/mL. Performed at [facility], [address], [place], [place] [address]     Blood Alcohol level:  Lab Results  Component Value Date   ETH <10 04/23/2018    Metabolic Disorder Labs: Lab Results  Component Value Date   HGBA1C 4.5 (L) 04/24/2018   MPG 82.45 04/24/2018   Lab Results  Component Value Date   PROLACTIN 27.3 (H) 04/24/2018   Lab Results   Component Value Date   CHOL 145 04/24/2018   TRIG 85 04/24/2018   HDL 29 (L) 04/24/2018   CHOLHDL 5.0 04/24/2018   VLDL 17 04/24/2018   LDLCALC 99 04/24/2018    Physical Findings: AIMS:  , ,  ,  ,    CIWA:    COWS:     Musculoskeletal: Strength & Muscle Tone: within normal limits Gait & Station: normal Patient leans: N/A  Psychiatric Specialty Exam: Physical Exam  Nursing note and vitals reviewed.   Review of Systems  Constitutional: Negative for chills and fever.  Respiratory: Negative for cough and shortness of breath.   Cardiovascular: Negative for chest pain.  Gastrointestinal: Negative for abdominal pain, heartburn, nausea and vomiting.  Psychiatric/Behavioral: Negative for depression, hallucinations and suicidal ideas. The patient is nervous/anxious. The patient does not have insomnia.     Blood pressure 132/81, pulse (!) 102, temperature 97.7 F (36.5 C), temperature source Oral, resp. rate 20, height 6\' 1"  (1.854 m), weight 83.9 kg, SpO2 98 %.Body mass index is 24.41 kg/m.  General Appearance: Casual and Fairly Groomed  Eye Contact:  Good  Speech:  Clear and Coherent and Pressured  Volume:  Normal  Mood:  Anxious and Euphoric  Affect:  Constricted, Labile and Tearful  Thought Process:  Coherent and Goal Directed  Orientation:  Full (Time, Place, and Person)  Thought Content:  Logical and Tangential  Suicidal Thoughts:  No  Homicidal Thoughts:  No  Memory:  Immediate;   Fair Recent;   Fair Remote;   Fair  Judgement:  Fair  Insight:  Lacking  Psychomotor Activity:  Normal  Concentration:  Concentration: Fair  Recall:  [employer] of Knowledge:  Fair  Language:  Fair  Akathisia:  No  Handed:    AIMS (if indicated):     Assets:  Resilience Social Support  ADL's:  Intact  Cognition:  WNL  Sleep:  Number of Hours: 6.75   Treatment Plan Summary: Daily contact with patient to assess and evaluate symptoms and progress in treatment and Medication  management   -Continue inpatient hospitalization  -Bipolar I, current episode manic with psychotic features   -Continue saphris 5mg  SL BID   -Continue lithium CR 300mg  po BID   -Lithium level on 04/28/18 AM  -anxiety     -Continue vistaril 50mg  po q6h prn anxiety  -insomnia   -Continue trazodone 50mg  po qhs prn insomnia  -agitation   -Continue zydis 5mg  po q8h prn agitation   -Continue geodon 20mg  IM q12h prn severe agitation  -Continue ativan 1mg  po/IM q8h prn agitation  -  Encourage participation in groups and therapeutic milieu  -disposition planning will be ongoing  Micheal Likens, MD 04/25/2018, 12:48 PM

## 2018-04-25 NOTE — Progress Notes (Signed)
Recreation Therapy Notes  Date: 11.1.19 Time: 1000 Location: 500 Hall  Group Topic: Communication  Goal Area(s) Addresses:  Patient will effectively communicate with peers in group.  Patient will effectively communicate to achieve shared group goal. Patient will identify techniques that made activity effective for group.   Intervention: Round rubber discs  Activity: Sharks in [employer].  Each person was given a rubber disc and one disc was given to the group.  Patients were to use the discs to move the group from the starting point to the end of the hall and back.  If anyone stepped off their disc, the group would have to start over.  Education: Communication, Discharge Planning  Education Outcome: Acknowledges understanding/In group clarification offered/Needs additional education.   Clinical Observations/Feedback: Pt did not attend group.    [name], LRT/CTRS         [name] A 04/25/2018 11:25 AM

## 2018-04-25 NOTE — Progress Notes (Signed)
D: Pt reports having slept "good" last night. Pt denies experiencing any pain, SI/HI, or AVH at this time. Pt reports have a "good" appetite, "normal" energy level, and "good" concentration. Pt's goal is to continue to work to returning home to family.   Upon assessment pt is pleasant and cooperative.   A: Schedule medications administered to pt, per MD orders. Support and encouragement provided. Frequent verbal contact made. Routine safety checks conducted q15 minutes.   R: No adverse medication reactions noted. Pt verbally contracts for safety at this time. Pt complaint with medications and treatment plan. Pt receptive and cooperative. Pt interacts well with others on the unit. Pt remains safe that this time. Will continue to monitor.

## 2018-04-26 NOTE — Progress Notes (Signed)
Adult Psychoeducational Group Note  Date:  04/26/2018 Time:  8:28 PM  Group Topic/Focus:  Wrap-Up Group:   The focus of this group is to help patients review their daily goal of treatment and discuss progress on daily workbooks.  Participation Level:  Active  Participation Quality:  Appropriate  Affect:  Appropriate  Cognitive:  Appropriate  Insight: Appropriate  Engagement in Group:  Engaged  Modes of Intervention:  Discussion  Additional Comments:  The patient expressed that she rates today a 10.The patient also said that she attended group.  Octavio Manns 04/26/2018, 8:28 PM

## 2018-04-26 NOTE — Progress Notes (Signed)
DAR NOTE: Patient presents with calm affect and bright mood.  Pt has been visible in the milieu. Pt observed interacting with peers well in the day room. Pt stated he is improving, and feeling better although he misses his family. Reports good sleep, good appetite, normal energy, and good concentration. Denies pain,SI/HI, auditory and visual hallucinations.  Rates depression at 0, hopelessness at 0, and anxiety at 0.  Maintained on routine safety checks.  Medications given as prescribed.  Support and encouragement offered as needed.  Attended group and participated.  States goal for today is " working on bettering myself  In order to go home to my family." Patient observed socializing with peers in the dayroom.  Offered no complaint.

## 2018-04-26 NOTE — Progress Notes (Addendum)
[facility] MD Progress Note  04/26/2018 11:18 AM Arthur Mitchell  MRN:  [id number]  Evaluation: Arthur Mitchell seen sitting on bed.  He is awake, alert and oriented x3. presents pleasant, calm and cooperative.  Continues to present slight pressure however mood and speech has been improving since admission.  Dentist states he has been doing a lot of "self reflection" and feels like he needed to be here. However reports I feel ready to discharge as he reports missing his family.  Continues to deny suicidal or homicidal ideations.  Denies auditory visual hallucinations.  Arthur Mitchell reports taking medications as prescribed and tolerating them well.  Arthur Mitchell was initiated on lithium and Saphris during his inpatient admission.  Reports taking and tolerating medications well.  Arthur Mitchell reports he has been resting well with medication.  As he reports he enjoys daily group sessions. NP to follow-up with social worker consider partial hospitalization follow-up at discharge Arthur Mitchell was agreeable to plan.  Support encouragement reassurance was provided.    Arthur Mitchell is a 37 y/o M with history of treatment for ADHD who was admitted from [facility] on IVC initiated in the ED after being brought to [facility] as a walk-in by his family with concern for worsening symptoms of grandiosity, flight of ideas, decreased need for sleep, pressured speech, and elevated mood. Pt was medically cleared and then transferred to [facility] for additional treatment and stabilization. Upon initial interview, pt is pressured, euphoric, and has intense stare. He is generally cooperative with interview, but he is circumstantial with his responses and often requires redirection to stay on topic. When asked why he was brought to the hospital, pt shares convoluted story about coming to recent realization that he wants to quit his job and "build a vision" akin to an amusement park like [employer] "for the enjoyment of all." He called his brother with whom he has rare contact to  explain about his investment opportunity and made promises of making millions of dollars, and pt thinks that triggered concern from his family members. 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 He has some insight that his current state is elevated and his rate of thinking has increased. He denies symptoms of depression. He has been sleeping about 3-6 hours per night, and he states that is his typical baseline. He denies SI/HI/AH/VH. He endorses some distractibility, flight of ideas, increased activities, and pressured speech. He denies thoughtlessness behaviors, but as per intake history pt has reportedly been giving away thousands of dollars in merchandise/cash. He denies symptoms of OCD and PTSD. He drinks about 5x per year and consumes 2-3 alcoholic beverages on those occassions. He uses cannabis twice daily, but he otherwise denies all other illicit substance use including tobacco. Discussed with Arthur Mitchell about treatment plan. He recently has been taking adderall at home, and he thinks that his dose is 15-20mg  po BID. He also takes Clomid (unknown dose) for low testosterone. Discussed with Arthur Mitchell that adderall may be contributing to his current presentation and he would likely benefit from discontinuing that medication. Pt was in agreement, and he also agrees to trial of lithium and saphris for mood stabilization. Pt was in agreement with the above plan, and he had no further questions, comments, or concerns.  Principal Problem: Bipolar I disorder, single manic episode, severe, with psychosis (HCC) Diagnosis:   Arthur Mitchell Active Problem List   Diagnosis Date Noted  .  Bipolar I disorder, single manic episode, severe, with psychosis (HCC) [F30.2] 04/24/2018   Total Time spent with Arthur Mitchell: 30 minutes  Past Psychiatric History: see H&P  Past Medical History: History reviewed. No  pertinent past medical history.  Past Surgical History:  Procedure Laterality Date  . ORTHOPEDIC SURGERY     Family History: History reviewed. No pertinent family history. Family Psychiatric  History: see H&P Social History:  Social History   Substance and Sexual Activity  Alcohol Use Yes   Comment: occ     Social History   Substance and Sexual Activity  Drug Use No    Social History   Socioeconomic History  . Marital status: Married    Spouse name: Not on file  . Number of children: Not on file  . Years of education: Not on file  . Highest education level: Not on file  Occupational History  . Not on file  Social Needs  . Financial resource strain: Not on file  . Food insecurity:    Worry: Not on file    Inability: Not on file  . Transportation needs:    Medical: Not on file    Non-medical: Not on file  Tobacco Use  . Smoking status: Never Smoker  . Smokeless tobacco: Never Used  Substance and Sexual Activity  . Alcohol use: Yes    Comment: occ  . Drug use: No  . Sexual activity: Not on file  Lifestyle  . Physical activity:    Days per week: Not on file    Minutes per session: Not on file  . Stress: Not on file  Relationships  . Social connections:    Talks on phone: Not on file    Gets together: Not on file    Attends religious service: Not on file    Active member of club or organization: Not on file    Attends meetings of clubs or organizations: Not on file    Relationship status: Not on file  Other Topics Concern  . Not on file  Social History Narrative  . Not on file   Additional Social History:                         Sleep: Good  Appetite:  Good  Current Medications: Current Facility-Administered Medications  Medication Dose Route Frequency Provider Last Rate Last Dose  . acetaminophen (TYLENOL) tablet 650 mg  650 mg Oral Q6H PRN Oneta Rack, NP      . alum & mag hydroxide-simeth (MAALOX/MYLANTA) 200-200-20 MG/5ML  suspension 30 mL  30 mL Oral Q4H PRN Oneta Rack, NP      . asenapine (SAPHRIS) sublingual tablet 5 mg  5 mg Sublingual BID Micheal Likens, MD   5 mg at 04/26/18 0803  . hydrOXYzine (ATARAX/VISTARIL) tablet 50 mg  50 mg Oral Q6H PRN Micheal Likens, MD      . lithium carbonate (LITHOBID) CR tablet 300 mg  300 mg Oral Q12H Micheal Likens, MD   300 mg at 04/26/18 0802  . LORazepam (ATIVAN) tablet 1 mg  1 mg Oral Q6H PRN Micheal Likens, MD       Or  . LORazepam (ATIVAN) injection 1 mg  1 mg Intramuscular Q6H PRN Jolyne Loa T, MD      . magnesium hydroxide (MILK OF MAGNESIA) suspension 30 mL  30 mL Oral Daily PRN Oneta Rack, NP      . OLANZapine  zydis (ZYPREXA) disintegrating tablet 5 mg  5 mg Oral Q8H PRN Micheal Likens, MD       Or  . ziprasidone (GEODON) injection 20 mg  20 mg Intramuscular Q12H PRN Micheal Likens, MD      . traZODone (DESYREL) tablet 50 mg  50 mg Oral QHS PRN Oneta Rack, NP        Lab Results:  No results found for this or any previous visit (from the past 48 hour(s)).  Blood Alcohol level:  Lab Results  Component Value Date   ETH <10 04/23/2018    Metabolic Disorder Labs: Lab Results  Component Value Date   HGBA1C 4.5 (L) 04/24/2018   MPG 82.45 04/24/2018   Lab Results  Component Value Date   PROLACTIN 27.3 (H) 04/24/2018   Lab Results  Component Value Date   CHOL 145 04/24/2018   TRIG 85 04/24/2018   HDL 29 (L) 04/24/2018   CHOLHDL 5.0 04/24/2018   VLDL 17 04/24/2018   LDLCALC 99 04/24/2018    Physical Findings: AIMS:  , ,  ,  ,    CIWA:    COWS:     Musculoskeletal: Strength & Muscle Tone: within normal limits Gait & Station: normal Arthur Mitchell leans: N/A  Psychiatric Specialty Exam: Physical Exam  Nursing note and vitals reviewed. Psychiatric: He has a normal mood and affect.    Review of Systems  Psychiatric/Behavioral: Negative for depression,  hallucinations and suicidal ideas. The Arthur Mitchell is nervous/anxious. The Arthur Mitchell does not have insomnia.   All other systems reviewed and are negative.   Blood pressure (!) 135/91, pulse 71, temperature 98.1 F (36.7 C), temperature source Oral, resp. rate 20, height 6\' 1"  (1.854 m), weight 83.9 kg, SpO2 98 %.Body mass index is 24.41 kg/m.  General Appearance: Casual and Fairly Groomed  Eye Contact:  Good  Speech:  Clear and Coherent and Pressured  Volume:  Normal  Mood:  Anxious and Euphoric  Affect:  Labile  Thought Process:  Coherent and Goal Directed  Orientation:  Full (Time, Place, and Person)  Thought Content:  Logical, Tangential and Pressure  Suicidal Thoughts:  No  Homicidal Thoughts:  No  Memory:  Immediate;   Fair Recent;   Fair Remote;   Fair  Judgement:  Fair  Insight:  Lacking  Psychomotor Activity:  Normal  Concentration:  Concentration: Fair  Recall:  [employer] of Knowledge:  Fair  Language:  Fair  Akathisia:  No  Handed:    AIMS (if indicated):     Assets:  Resilience Social Support  ADL's:  Intact  Cognition:  WNL  Sleep:  Number of Hours: 6.75   Treatment Plan Summary: Daily contact with Arthur Mitchell to assess and evaluate symptoms and progress in treatment and Medication management   Continue with current treatment plan on 04/26/2018 as listed below except were noted  -Bipolar I, current episode manic with psychotic features   -Continue saphris 5mg  SL BID   -Continue lithium CR 300mg  po BID   -Lithium level on 04/28/18 AM  -anxiety     -Continue vistaril 50mg  po q6h prn anxiety  -insomnia   -Continue trazodone 50mg  po qhs prn insomnia  -agitation   -Continue zydis 5mg  po q8h prn agitation   -Continue geodon 20mg  IM q12h prn severe agitation  -Continue ativan 1mg  po/IM q8h prn agitation  -Encourage participation in groups and therapeutic milieu -disposition planning will be ongoing  Oneta Rack, NP 04/26/2018, 11:18 AM   ..  Agree with NP  Progress Note

## 2018-04-26 NOTE — [facility] Group Notes (Signed)
[facility] Group Notes:  (Nursing)  Date:  04/26/2018  Time: 130 PM Type of Therapy:  Nurse Education  Participation Level:  Active  Participation Quality:  Appropriate and Attentive  Affect:  Appropriate  Cognitive:  Appropriate  Insight:  Appropriate  Engagement in Group:  Engaged  Modes of Intervention:  Discussion and Education  Summary of Progress/Problems: Nurse led group played a non competitive learning/communication board game that fosters listening skills as well as self expression Shela Nevin 04/26/2018, 4:24 PM

## 2018-04-26 NOTE — [facility] Group Notes (Signed)
  [facility] LCSW Group Therapy Note  Date/Time:  04/26/2018 11:15AM-12:00PM  Type of Therapy and Topic:  Group Therapy:  Feelings About Hospitalization  Participation Level:  Active   Description of Group This process group involved patients discussing their feelings related to being hospitalized, as well as the benefits they see to being in the hospital.  These feelings and benefits were itemized.  The group then brainstormed specific ways in which they could seek those same benefits when they discharge and return home.  Therapeutic Goals 1. Patient will identify and describe positive and negative feelings related to hospitalization 2. Patient will verbalize benefits of hospitalization to themselves personally 3. Patients will brainstorm together ways they can obtain similar benefits in the outpatient setting, identify barriers to wellness and possible solutions  Summary of Patient Progress:  The patient expressed his primary feelings about being hospitalized are good now, but initially were anger because he was placed here involuntarily.  He explained various things he feels he has learned in the hospital including developing some close friendships.  He said to stay out of the hospital he is going to use his considerable support system more frequently and openly, and he is going to do a better job of regulating his sleep.  Therapeutic Modalities Cognitive Behavioral Therapy Motivational Interviewing    Ambrose Mantle, LCSW 04/26/2018, 1:15 PM

## 2018-04-27 NOTE — [facility] Group Notes (Addendum)
[facility] Group Notes:  (Nursing)  Date:  04/27/2018  Time: 945  Type of Therapy:  Nurse Education  Participation Level:  Active  Participation Quality:  Appropriate and Attentive  Affect:  Appropriate  Cognitive:  Alert and Appropriate  Insight:  Appropriate and Good  Engagement in Group:  Engaged and Supportive  Modes of Intervention:  Discussion and Education  Summary of Progress/Problems: Nurse led Orientation/Goals group  Shela Nevin 04/27/2018, 10:16 AM

## 2018-04-27 NOTE — Plan of Care (Signed)
D:Patient observed in milieu interacting with peers and staff. Patient denies SI/HI and A/V hallucinations. Patient is pleasant, clam and cooperative. Patient voices no concerns.  A:Patient provided support and encouragement. Patient administered medications as prescribed. Q 15 minute checks in progress and patient remains safe on unit.  R: Patient remains safe on unit. Patient voices no concerns. Patient attends groups and patient is taking medications as prescribed. Monitoring continues.    Problem: Education: Goal: Emotional status will improve Outcome: Progressing Goal: Mental status will improve Outcome: Progressing   Problem: Activity: Goal: Sleeping patterns will improve Outcome: Progressing   Problem: Safety: Goal: Periods of time without injury will increase Outcome: Progressing

## 2018-04-27 NOTE — Progress Notes (Signed)
Patient has been in his room resting and got up and spoke with writer 1:1. He reports that he is discharging on tomorrow and is looking forward to seeing his family and dogs. He denies si/hi/a/v hallucinations

## 2018-04-27 NOTE — [facility] Group Notes (Signed)
[facility] LCSW Group Therapy Note  Date/Time:  04/27/2018  11:00AM-12:00PM  Type of Therapy and Topic:  Group Therapy:  Music and Mood  Participation Level:  Active   Description of Group: In this process group, members listened to a variety of genres of music and identified that different types of music evoke different responses.  Patients were encouraged to identify music that was soothing for them and music that was energizing for them.  Patients discussed how this knowledge can help with wellness and recovery in various ways including managing depression and anxiety as well as encouraging healthy sleep habits.    Therapeutic Goals: 1. Patients will explore the impact of different varieties of music on mood 2. Patients will verbalize the thoughts they have when listening to different types of music 3. Patients will identify music that is soothing to them as well as music that is energizing to them 4. Patients will discuss how to use this knowledge to assist in maintaining wellness and recovery 5. Patients will explore the use of music as a coping skill  Summary of Patient Progress:  At the beginning of group, patient expressed that he felt good and had a "normal" level of energy today.  Throughout group he expressed himself frequently and was quite able to analyze his feelings in reaction to various songs.  At the end of group he said he felt "even better."  Therapeutic Modalities: Solution Focused Brief Therapy Activity   Ambrose Mantle, LCSW

## 2018-04-27 NOTE — Progress Notes (Signed)
[facility] MD Progress Note  04/27/2018 10:55 AM Arthur Mitchell  MRN:  [id number]  Evaluation: Arthur Mitchell attending daily group session, with active and engaged participation.  Patient presents pleasant, calm and cooperative.  Patient reports feeling a lot better and feels ready to discharge.  Patient presents less pressure during this.  Patient reports his mood is improving daily.  NP followed up with CSW recommend partial hospitalization after discharge patient was agreeable to plan.  Denies depression or depressive symptoms.  Patient reports he misses his family and is hopeful to discharge on tomorrow.  Continues to report taking medications as prescribed and tolerating them well.  Reports his wife came to visit on last night states family visit went well.  Patient reports follow-up with [facility]. Support, encouragement and reassurance was provided.   Arthur Mitchell is a 37 y/o M with history of treatment for ADHD who was admitted from [facility] on IVC initiated in the ED after being brought to [facility] as a walk-in by his family with concern for worsening symptoms of grandiosity, flight of ideas, decreased need for sleep, pressured speech, and elevated mood. Pt was medically cleared and then transferred to [facility] for additional treatment and stabilization.Upon initial interview, pt is pressured, euphoric, and has intense stare. He is generally cooperative with interview, but he is circumstantial with his responses and often requires redirection to stay on topic. When asked why he was brought to the hospital, pt shares convoluted story about coming to recent realization that he wants to quit his job and "build a vision" akin to an amusement park like [place] "for the enjoyment of all." He called his brother with whom he has rare contact to explain about his investment opportunity and made promises of making millions of dollars, and pt thinks that triggered concern from his family members. Pt described building a  display involving his previous employers and senators in the ED, but he is more vague and guarded when asked about specific details of his plans. He has some insight that his current state is elevated and his rate of thinking has increased. He denies symptoms of depression. He has been sleeping about 3-6 hours per night, and he states that is his typical baseline. He denies SI/HI/AH/VH. He endorses some distractibility, flight of ideas, increased activities, and pressured speech. He denies thoughtlessness behaviors, but as per intake history pt has reportedly been giving away thousands of dollars in merchandise/cash. He denies symptoms of OCD and PTSD. He drinks about 5x per year and consumes 2-3 alcoholic beverages on those occassions. He uses cannabis twice daily, but he otherwise denies all other illicit substance use including tobacco.   Principal Problem: Bipolar I disorder, single manic episode, severe, with psychosis (HCC) Diagnosis:   Patient Active Problem List   Diagnosis Date Noted  . Bipolar I disorder, single manic episode, severe, with psychosis (HCC) [F30.2] 04/24/2018   Total Time spent with patient: 30 minutes  Past Psychiatric History: see H&P  Past Medical History: History reviewed. No pertinent past medical history.  Past Surgical History:  Procedure Laterality Date  . ORTHOPEDIC SURGERY     Family History: History reviewed. No pertinent family history. Family Psychiatric  History: see H&P Social History:  Social History   Substance and Sexual Activity  Alcohol Use Yes   Comment: occ     Social History   Substance and Sexual Activity  Drug Use No    Social History   Socioeconomic History  . Marital status: Married  Spouse name: Not on file  . Number of children: Not on file  . Years of education: Not on file  . Highest education level: Not on file  Occupational History  . Not on file  Social Needs  . Financial resource strain: Not on file  . Food  insecurity:    Worry: Not on file    Inability: Not on file  . Transportation needs:    Medical: Not on file    Non-medical: Not on file  Tobacco Use  . Smoking status: Never Smoker  . Smokeless tobacco: Never Used  Substance and Sexual Activity  . Alcohol use: Yes    Comment: occ  . Drug use: No  . Sexual activity: Not on file  Lifestyle  . Physical activity:    Days per week: Not on file    Minutes per session: Not on file  . Stress: Not on file  Relationships  . Social connections:    Talks on phone: Not on file    Gets together: Not on file    Attends religious service: Not on file    Active member of club or organization: Not on file    Attends meetings of clubs or organizations: Not on file    Relationship status: Not on file  Other Topics Concern  . Not on file  Social History Narrative  . Not on file   Additional Social History:                         Sleep: Good  Appetite:  Good  Current Medications: Current Facility-Administered Medications  Medication Dose Route Frequency Provider Last Rate Last Dose  . acetaminophen (TYLENOL) tablet 650 mg  650 mg Oral Q6H PRN Oneta Rack, NP      . alum & mag hydroxide-simeth (MAALOX/MYLANTA) 200-200-20 MG/5ML suspension 30 mL  30 mL Oral Q4H PRN Oneta Rack, NP      . asenapine (SAPHRIS) sublingual tablet 5 mg  5 mg Sublingual BID Micheal Likens, MD   5 mg at 04/27/18 0740  . hydrOXYzine (ATARAX/VISTARIL) tablet 50 mg  50 mg Oral Q6H PRN Micheal Likens, MD      . lithium carbonate (LITHOBID) CR tablet 300 mg  300 mg Oral Q12H Micheal Likens, MD   300 mg at 04/27/18 0740  . LORazepam (ATIVAN) tablet 1 mg  1 mg Oral Q6H PRN Micheal Likens, MD       Or  . LORazepam (ATIVAN) injection 1 mg  1 mg Intramuscular Q6H PRN Micheal Likens, MD      . magnesium hydroxide (MILK OF MAGNESIA) suspension 30 mL  30 mL Oral Daily PRN Oneta Rack, NP      .  OLANZapine zydis (ZYPREXA) disintegrating tablet 5 mg  5 mg Oral Q8H PRN Micheal Likens, MD       Or  . ziprasidone (GEODON) injection 20 mg  20 mg Intramuscular Q12H PRN Micheal Likens, MD      . traZODone (DESYREL) tablet 50 mg  50 mg Oral QHS PRN Oneta Rack, NP        Lab Results:  No results found for this or any previous visit (from the past 48 hour(s)).  Blood Alcohol level:  Lab Results  Component Value Date   ETH <10 04/23/2018    Metabolic Disorder Labs: Lab Results  Component Value Date   HGBA1C 4.5 (L) 04/24/2018  MPG 82.45 04/24/2018   Lab Results  Component Value Date   PROLACTIN 27.3 (H) 04/24/2018   Lab Results  Component Value Date   CHOL 145 04/24/2018   TRIG 85 04/24/2018   HDL 29 (L) 04/24/2018   CHOLHDL 5.0 04/24/2018   VLDL 17 04/24/2018   LDLCALC 99 04/24/2018    Physical Findings: AIMS:  , ,  ,  ,    CIWA:    COWS:     Musculoskeletal: Strength & Muscle Tone: within normal limits Gait & Station: normal Patient leans: N/A  Psychiatric Specialty Exam: Physical Exam  Nursing note and vitals reviewed. Psychiatric: He has a normal mood and affect.    Review of Systems  Psychiatric/Behavioral: Negative for depression, hallucinations and suicidal ideas. The patient is nervous/anxious. The patient does not have insomnia.   All other systems reviewed and are negative.   Blood pressure (!) 115/99, pulse 78, temperature 98.7 F (37.1 C), temperature source Oral, resp. rate 20, height 6\' 1"  (1.854 m), weight 83.9 kg, SpO2 98 %.Body mass index is 24.41 kg/m.  General Appearance: Casual and Fairly Groomed  Eye Contact:  Good  Speech:  Clear and Coherent and Pressured-improving  Volume:  Normal  Mood:  Anxious and Euphoric  Affect:  Labile  Thought Process:  Coherent and Goal Directed  Orientation:  Full (Time, Place, and Person)  Thought Content:  Logical, Tangential and Pressure  Suicidal Thoughts:  No  Homicidal  Thoughts:  No  Memory:  Immediate;   Fair Recent;   Fair Remote;   Fair  Judgement:  Fair  Insight:  Lacking  Psychomotor Activity:  Normal  Concentration:  Concentration: Fair  Recall:  [employer] of Knowledge:  Fair  Language:  Fair  Akathisia:  No  Handed:    AIMS (if indicated):     Assets:  Resilience Social Support  ADL's:  Intact  Cognition:  WNL  Sleep:  Number of Hours: 7   Treatment Plan Summary: Daily contact with patient to assess and evaluate symptoms and progress in treatment and Medication management   Continue with current treatment plan on 04/27/2018 as listed below except were noted  -Bipolar I, current episode manic with psychotic features   -Continue saphris 5mg  SL BID   -Continue lithium CR 300mg  po BID   -Lithium level on 04/28/18 AM  -anxiety     -Continue vistaril 50mg  po q6h prn anxiety  -insomnia   -Continue trazodone 50mg  po qhs prn insomnia  -agitation   -Continue zydis 5mg  po q8h prn agitation   -Continue geodon 20mg  IM q12h prn severe agitation  -Continue ativan 1mg  po/IM q8h prn agitation  -Encourage participation in groups and therapeutic milieu -disposition planning will be ongoing  Oneta Rack, NP 04/27/2018, 10:55 AM

## 2018-04-27 NOTE — Progress Notes (Addendum)
D. Pt observed interacting well with peers in the milieu- has been calm and cooperative- friendly during interactions. Per pt's self inventory, pt rates his depression, hopelessness and anxiety all 0's. Pt writes that his most important goal today is "working on going home to my family" and "attend all groups". Pt currently denies pain, SI/HI and AV hallucinations. A. Labs and vitals monitored. Pt compliant with medications. Pt supported emotionally and encouraged to express concerns and ask questions.   R. Pt remains safe with 15 minute checks. Will continue POC.

## 2018-04-28 LAB — LITHIUM LEVEL: LITHIUM LVL: 0.21 mmol/L — AB (ref 0.60–1.20)

## 2018-04-28 MED ORDER — ASENAPINE MALEATE 5 MG SL SUBL: 5.0000 mg | SUBLINGUAL_TABLET | Freq: Two times a day (BID) | SUBLINGUAL | 0 refills | Status: AC

## 2018-04-28 MED ORDER — HYDROXYZINE HCL 50 MG PO TABS: 50.0000 mg | ORAL_TABLET | Freq: Four times a day (QID) | ORAL | 0 refills | Status: AC | PRN

## 2018-04-28 MED ORDER — TRAZODONE HCL 50 MG PO TABS: 50.0000 mg | ORAL_TABLET | Freq: Every evening | ORAL | 0 refills | Status: AC | PRN

## 2018-04-28 MED ORDER — LITHIUM CARBONATE ER 300 MG PO TBCR: 300.0000 mg | EXTENDED_RELEASE_TABLET | Freq: Two times a day (BID) | ORAL | 0 refills | Status: AC

## 2018-04-28 NOTE — BHH Suicide Risk Assessment (Signed)
[facility] Discharge Suicide Risk Assessment   Principal Problem: Bipolar I disorder, single manic episode, severe, with psychosis [facility]) Discharge Diagnoses:  Patient Active Problem List   Diagnosis Date Noted  . Bipolar I disorder, single manic episode, severe, with psychosis (HCC) [F30.2] 04/24/2018    Total Time spent with patient: 30 minutes  Musculoskeletal: Strength & Muscle Tone: within normal limits Gait & Station: normal Patient leans: N/A  Psychiatric Specialty Exam: ROS  Blood pressure 118/82, pulse (!) 101, temperature 98.3 F (36.8 C), temperature source Oral, resp. rate 20, height 6\' 1"  (1.854 m), weight 83.9 kg, SpO2 98 %.Body mass index is 24.41 kg/m.  General Appearance: Casual  Eye Contact::  Good  Speech:  Clear and Coherent409  Volume:  Normal  Mood:  Euthymic  Affect:  Constricted- nl  Thought Process:  Coherent  Orientation:  full  Thought Content:  Logical  Suicidal Thoughts:  No  Homicidal Thoughts:  No  Memory:  Immediate;   Good  Judgement:  Good  Insight:  Good  Psychomotor Activity:  Normal  Concentration:  Good  Recall:  Good  Fund of Knowledge:Good  Language: Good  Akathisia:  none  Handed:  Right  AIMS (if indicated):     Assets:  Communication Skills  Sleep:  Number of Hours: 6  Cognition: WNL  ADL's:  Intact   Mental Status Per Nursing Assessment::   On Admission:  NA  Demographic Factors:  NA  Loss Factors: Decrease in vocational status  Historical Factors: NA  Risk Reduction Factors:   NA  Continued Clinical Symptoms:  Dysthymia  Cognitive Features That Contribute To Risk:  None    Suicide Risk:  Minimal: No identifiable suicidal ideation.  Patients presenting with no risk factors but with morbid ruminations; may be classified as minimal risk based on the severity of the depressive symptoms  Follow-up Information    [facility] Go on 04/30/2018.   Why:  Please attend your therapy appt with Cam Hines on  Wednesday, 04/30/18, at 10:00am.  Please attend your medication appt with Rosemarie Beath on Wednesday, 05/07/18, at 10:30.  Please call the office the day your discharge to pay the $20 deposit. Contact information: [address] [place] [address] [phone]        BEHAVIORAL HEALTH PARTIAL HOSPITALIZATION PROGRAM Follow up.   Specialty:  Behavioral Health Why:  ask patient to see if he is interested in [facility] Contact information: [address] [place] [id number] mc [place] [address] [phone]          Plan Of Care/Follow-up recommendations:  Activity:  full  Deitra Craine, MD 04/28/2018, 10:01 AM

## 2018-04-28 NOTE — Progress Notes (Signed)
  [facility] Adult Case Management Discharge Plan :  Will you be returning to the same living situation after discharge:  Yes,  home At discharge, do you have transportation home?: Yes,  wife Do you have the ability to pay for your medications: Yes,  [employer]  Release of information consent forms completed and submitted to medical records by CSW.   Patient to Follow up at: Follow-up Information    [facility] Go on 04/30/2018.   Why:  Please attend your therapy appt with Cam Hines on Wednesday, 04/30/18, at 10:00am.  Please attend your medication appt with Rosemarie Beath on Wednesday, 05/07/18, at 10:30.  Please call the office the day your discharge to pay the $20 deposit. Contact information: [address] [place] [address] [phone]        BEHAVIORAL HEALTH PARTIAL HOSPITALIZATION PROGRAM Follow up on 05/01/2018.   Specialty:  Behavioral Health Why:  Assessment for Partial Hospitalization Program on Thursday, 11/7 at 2:30PM. You must arrive 1:30PM to complete new patient paperwork. Please bring Photo ID and insurance card to this appt. Thank you.  Contact information: [address] [id number] mc [place] [address] [phone]          Next level of care provider has access to [facility] Link:no  Safety Planning and Suicide Prevention discussed: Yes,  SPE completed with pt and his wife. SPI pamphlet and mobile crisis information provided to pt.   Have you used any form of tobacco in the last 30 days? (Cigarettes, Smokeless Tobacco, Cigars, and/or Pipes): No  Has patient been referred to the Quitline?: N/A patient is not a smoker  Patient has been referred for addiction treatment: Yes  Rona Ravens, LCSW 04/28/2018, 11:19 AM

## 2018-04-28 NOTE — Plan of Care (Signed)
Pt contributed to group spontaneously showing improved communication skills at completion of recreation therapy group sessions.   [name], LRT/CTRS

## 2018-04-28 NOTE — Progress Notes (Signed)
Recreation Therapy Notes  INPATIENT RECREATION TR PLAN  Patient Details Name: Arthur Mitchell MRN: [id number] DOB: Oct 29, 1980 Today's Date: 04/28/2018  Rec Therapy Plan Is patient appropriate for Therapeutic Recreation?: Yes Treatment times per week: about 3 days Estimated Length of Stay: 5-7 days TR Treatment/Interventions: Group participation (Comment)  Discharge Criteria Pt will be discharged from therapy if:: Discharged Treatment plan/goals/alternatives discussed and agreed upon by:: Patient/family  Discharge Summary Short term goals set: See patient care plan Short term goals met: Complete Progress toward goals comments: Groups attended Which groups?: Coping skills, Other (Comment)(Team building) Reason goals not met: None Therapeutic equipment acquired: N/A Reason patient discharged from therapy: Discharge from hospital Pt/family agrees with progress & goals achieved: Yes Date patient discharged from therapy: 04/28/18    Victorino Sparrow, LRT/CTRS  Ria Comment, [place] 04/28/2018, 12:01 PM

## 2018-04-28 NOTE — Progress Notes (Signed)
Recreation Therapy Notes  Date: 11.4.19 Time: 1000 Location: [address] Dayroom  Group Topic: Coping Skills  Goal Area(s) Addresses:  Patient will be able to identify positive coping skills. Patient will identify benefits of using coping skills post d/c.  Behavioral Response: Engaged  Intervention: Worksheet  Activity: Mind map.  LRT and patients filled in the first 8 boxes of the mind map together (anxiety, depression, exhaustion, mood, frustration, stress, work and anger).  Individually, patients would come up with 3 coping skills for each situation.  The group would comeback together and LRT would fill in the coping skills on the board.  Education: Pharmacologist, Building control surveyor.   Education Outcome: Acknowledges understanding/In group clarification offered/Needs additional education.   Clinical Observations/Feedback: Pt was active and engaged.  Pt identified some coping skills as aromatherapy, think happy thoughts, laughter/smiling, take a nap, medication, count backwards, walk, sleep, ask for help, get there early, deep breaths and walk away.    [name], LRT/CTRS      [name] A 04/28/2018 11:24 AM

## 2018-04-28 NOTE — Discharge Summary (Signed)
Physician Discharge Summary Note  Patient:  Arthur Mitchell is an 37 y.o., male  MRN:  [id number]  DOB:  [birth year]  Patient phone:  [phone] (home)   Patient address:   [address] [place] [address],   Total Time spent with patient: Greater than 30 minutes  Date of Admission:  04/23/2018  Date of Discharge: 04-28-18  Reason for Admission: Worsening symptoms of grandiosity, flight of ideas, decreased need for sleep, pressured speech and elevated mood  Principal Problem: Bipolar I disorder, single manic episode, severe, with psychosis [facility])  Discharge Diagnoses: Patient Active Problem List   Diagnosis Date Noted  . Bipolar I disorder, single manic episode, severe, with psychosis (HCC) [F30.2] 04/24/2018   Past Psychiatric History: Bipolar affective disorder, manic episodes.  Past Medical History: History reviewed. No pertinent past medical history.  Past Surgical History:  Procedure Laterality Date  . ORTHOPEDIC SURGERY     Family History: History reviewed. No pertinent family history.  Family Psychiatric  History: See H&P  Social History:  Social History   Substance and Sexual Activity  Alcohol Use Yes   Comment: occ     Social History   Substance and Sexual Activity  Drug Use No    Social History   Socioeconomic History  . Marital status: Married    Spouse name: Not on file  . Number of children: Not on file  . Years of education: Not on file  . Highest education level: Not on file  Occupational History  . Not on file  Social Needs  . Financial resource strain: Not on file  . Food insecurity:    Worry: Not on file    Inability: Not on file  . Transportation needs:    Medical: Not on file    Non-medical: Not on file  Tobacco Use  . Smoking status: Never Smoker  . Smokeless tobacco: Never Used  Substance and Sexual Activity  . Alcohol use: Yes    Comment: occ  . Drug use: No  . Sexual activity: Not on file  Lifestyle  .  Physical activity:    Days per week: Not on file    Minutes per session: Not on file  . Stress: Not on file  Relationships  . Social connections:    Talks on phone: Not on file    Gets together: Not on file    Attends religious service: Not on file    Active member of club or organization: Not on file    Attends meetings of clubs or organizations: Not on file    Relationship status: Not on file  Other Topics Concern  . Not on file  Social History Narrative  . Not on file   Hospital Course: (Per Md's admission evaluation):   Sho Salguero is a 37 y/o M with history of treatment for ADHD who was admitted from [facility] on IVC initiated in the ED after being brought to [facility] as a walk-in by his family with concern for worsening symptoms of grandiosity, flight of ideas, decreased need for sleep, pressured speech, and elevated mood. Pt was medically cleared and then transferred to [facility] for additional treatment and stabilization. Upon initial interview, pt is pressured, euphoric, and has intense stare. He is generally cooperative with interview, but he is circumstantial with his responses and often requires redirection to stay on topic. When asked why he was brought to the hospital, pt shares convoluted story about coming to recent realization that he wants to  quit his job and "build a vision" akin to an amusement park like [place] "for the enjoyment of all." He called his brother with whom he has rare contact to explain about his investment opportunity and made promises of making millions of dollars, and pt thinks that triggered concern from his family members. 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 He has some insight that his current state is elevated and his rate of thinking has increased. He denies symptoms of depression. He has been sleeping about 3-6 hours per night, and he  states that is his typical baseline. He denies SI/HI/AH/VH. He endorses some distractibility, flight of ideas, increased activities, and pressured speech. He denies thoughtlessness behaviors, but as per intake history pt has reportedly been giving away thousands of dollars in merchandise/cash. He denies symptoms of OCD and PTSD. He drinks about 5x per year and consumes 2-3 alcoholic beverages on those occassions.  Ebrahim was admitted to the [facility] adult unit for worsening symptoms of Bipolar I disorder, most recent episode with elevated mood, pressured speech, decreased need for sleep & flight of ideas. He in need of mood stabilization treatments. After the above admission assessment, Shameek was started on' Saphris 5 mg for mood control, Hydroxyzine 50 mg prn for anxiety, Lithium Carbonate 300 mg CR for mood control & Trazodone 50 mg for insomnia. He presented no other significant pre-existing medical problems that required treatrment. He tolerated his treatment regimen without any adverse effects or reactions.  During the course of his hospitalization, Elster's progress was monitored by observation & his daily report of symptom reduction. His was emotional & mental status were assessed & monitored by daily self-inventory reports completed by him & the clinical staff. He was also evaluated daily by the treatment team for mood stability and plans for continued recovery after discharge. Upon discharge, he was both mentally & medically stable. He is adamntly denying suicidal/homicidal ideation, auditory/visual/tactile hallucinations, delusional thoughts & or paranoia. He left [facility] with all personal belongings in no apparent distress.  Physical Findings: AIMS:  , ,  ,  ,    CIWA:    COWS:     Musculoskeletal: Strength & Muscle Tone: within normal limits Gait & Station: normal Patient leans: N/A  Psychiatric Specialty Exam: Physical Exam  Nursing note and vitals reviewed. Constitutional: He appears  well-developed.  HENT:  Head: Normocephalic.  Eyes: Pupils are equal, round, and reactive to light.  Neck: Normal range of motion.  Cardiovascular: Normal rate.  Respiratory: Effort normal.  GI: Soft.  Genitourinary:  Genitourinary Comments: Deferred  Musculoskeletal: Normal range of motion.  Neurological: He is alert.  Skin: Skin is warm.    Review of Systems  Constitutional: Negative.   HENT: Negative.   Eyes: Negative.   Respiratory: Negative.  Negative for cough and shortness of breath.   Cardiovascular: Negative.  Negative for chest pain and palpitations.  Gastrointestinal: Negative.  Negative for abdominal pain, heartburn, nausea and vomiting.  Genitourinary: Negative.   Skin: Negative.   Neurological: Negative.   Endo/Heme/Allergies: Negative.   Psychiatric/Behavioral: Positive for depression (Stable), hallucinations (Hx. psychopsis (stable)) and substance abuse (Hx. THC use). Negative for memory loss and suicidal ideas. The patient has insomnia (Stable). The patient is not nervous/anxious.     Blood pressure 118/82, pulse (!) 101, temperature 98.3 F (36.8 C), temperature source Oral, resp. rate 20, height 6'  1" (1.854 m), weight 83.9 kg, SpO2 98 %.Body mass index is 24.41 kg/m.  See Md's SRA   Have you used any form of tobacco in the last 30 days? (Cigarettes, Smokeless Tobacco, Cigars, and/or Pipes): No  Has this patient used any form of tobacco in the last 30 days? (Cigarettes, Smokeless Tobacco, Cigars, and/or Pipes): N/A  Blood Alcohol level:  Lab Results  Component Value Date   ETH <10 04/23/2018   Metabolic Disorder Labs:  Lab Results  Component Value Date   HGBA1C 4.5 (L) 04/24/2018   MPG 82.45 04/24/2018   Lab Results  Component Value Date   PROLACTIN 27.3 (H) 04/24/2018   Lab Results  Component Value Date   CHOL 145 04/24/2018   TRIG 85 04/24/2018   HDL 29 (L) 04/24/2018   CHOLHDL 5.0 04/24/2018   VLDL 17 04/24/2018   LDLCALC 99 04/24/2018    See Psychiatric Specialty Exam and Suicide Risk Assessment completed by Attending Physician prior to discharge.  Discharge destination:  Home  Is patient on multiple antipsychotic therapies at discharge:  No   Has Patient had three or more failed trials of antipsychotic monotherapy by history:  No  Recommended Plan for Multiple Antipsychotic Therapies: NA  Allergies as of 04/28/2018   No Known Allergies     Medication List    STOP taking these medications   amphetamine-dextroamphetamine 20 MG tablet Commonly known as:  ADDERALL   CLOMID 50 MG tablet Generic drug:  clomiPHENE     TAKE these medications     Indication  asenapine 5 MG Subl 24 hr tablet Commonly known as:  SAPHRIS Place 1 tablet (5 mg total) under the tongue 2 (two) times daily. For mood control  Indication:  Mood control   hydrOXYzine 50 MG tablet Commonly known as:  ATARAX/VISTARIL Take 1 tablet (50 mg total) by mouth every 6 (six) hours as needed for anxiety.  Indication:  Feeling Anxious   lithium carbonate 300 MG CR tablet Commonly known as:  LITHOBID Take 1 tablet (300 mg total) by mouth every 12 (twelve) hours. For mood stabilization  Indication:  Mood stabilization   traZODone 50 MG tablet Commonly known as:  DESYREL Take 1 tablet (50 mg total) by mouth at bedtime as needed for sleep.  Indication:  Trouble Sleeping      Follow-up Information    [facility] Go on 04/30/2018.   Why:  Please attend your therapy appt with Cam Hines on Wednesday, 04/30/18, at 10:00am.  Please attend your medication appt with Rosemarie Beath on Wednesday, 05/07/18, at 10:30.  Please call the office the day your discharge to pay the $20 deposit. Contact information: [address] [place] [address] [phone]        BEHAVIORAL HEALTH PARTIAL HOSPITALIZATION PROGRAM Follow up.   Specialty:  Behavioral Health Why:  ask patient to see if he is interested in [facility] Contact information: [address] [place] [phone] mc [place] [address] [phone]         Follow-up recommendations:  Activity:  As tolerated Diet: As recommended by your primary care doctor. Keep all scheduled follow-up appointments as recommended.  Comments: Patient is instructed prior to discharge to: Take all medications as prescribed by his/her mental healthcare provider. Report any adverse effects and or reactions from the medicines to his/her outpatient provider promptly. Patient has been instructed & cautioned: To not engage in alcohol and or illegal drug use while on prescription medicines. In the  event of worsening symptoms, patient is instructed to call the crisis hotline, 911 and or go to the nearest ED for appropriate evaluation and treatment of symptoms. To follow-up with his/her primary care provider for your other medical issues, concerns and or health care needs.   Signed: Armandina Stammer, NP, PMHNP, FNP-BC 04/28/2018, 9:45 AM

## 2018-04-28 NOTE — Progress Notes (Signed)
Patient discharged to lobby. Patient was stable and appreciative at that time. All papers and prescriptions were given and valuables returned. Verbal understanding expressed. Denies SI/HI and A/VH. Patient given opportunity to express concerns and ask questions.  

## 2018-05-01 ENCOUNTER — Other Ambulatory Visit (HOSPITAL_COMMUNITY): Payer: 59 | Attending: Psychiatry | Admitting: Licensed Clinical Social Worker

## 2018-05-01 DIAGNOSIS — F302 Manic episode, severe with psychotic symptoms: Secondary | ICD-10-CM

## 2018-05-08 NOTE — Psych (Signed)
Arthur LeydenDaniel M Lowery is a 37 y.o. male patient with a bipolar 1 diagnosis. [facility]. According to ED and inpatient notes, pt presented to the ED at urging of parents and brother and was admitted under IVC due to manic symptoms. [facility] and had [facility] 5 day admission. Pt states he was not diagnosed bipolar prior to this and does not agree with the diagnosis.  Pt presents as hypomanic in today's session, with pressured speech, possible grandiosity, positive elevated mood, disorganized thought, and lack of insight. In telling the story of how he ended up in the hospital, pt's story mirrors that of what is in his health record, overall, however pt's motivations, reasons, and point of view strongly differ. Pt is insistent that he was "jailed against his will" in the hospital and that all of his behaviors are easily explained by the increased energy he had due to being off work and having increased sleep. Pt stated many times "I am more at peace now than I ever have been."  He is accompanied by his wife, [name], who he requested be in the session. Pt's wife shares that she was out of state when the symptoms began and pt's family took him to the hospital. Wife agrees that the behaviors she was told and he exhibited to her via text were odd and out of character. She states the behaviors she observed in pt throughout visits in hospital were also out of character. Pt and wife have been together 17 years and wife reports she has never observed behaviors form pt that are consistent to his presentation in the hospital and since. She does state that he has been increasingly more "normal" since arriving home, with yesterday being "like a happy family." She shares today his behaviors are more escalated and she thinks it is due to anxiety about this appointment. Wife states some concern because pt has not been taking the medication from the  hospital. Pt states he got nothing out of his inpatient admission, although he was glad he was able to help others who were there because of all his life experience. Pt offered to come to [facility] to help the group members there, however he does not need it for him. When cln further explained group and expectations, pt declined the service.  Pt reports he went to his discharge therapy appointment yesterday and has a follow-up psychiatry appointment later in the week. Wife states she feels comfortable with pt at home and both report understanding of the importance in keeping and continuing to go to his outpatient appointments.  Pt denies SI/HI/AVH and is given crisis number should he need it.       Donia GuilesJenny Francheska Villeda, LCSW
